# Patient Record
Sex: Female | Born: 1990 | Race: White | Hispanic: No | Marital: Single | Smoking: Former smoker
Health system: Southern US, Community
[De-identification: ages and names within clinical notes are randomized; demographics above are authoritative.]

## PROBLEM LIST (undated history)

## (undated) DIAGNOSIS — D069 Carcinoma in situ of cervix, unspecified: Secondary | ICD-10-CM

## (undated) DIAGNOSIS — R87629 Unspecified abnormal cytological findings in specimens from vagina: Secondary | ICD-10-CM

## (undated) HISTORY — PX: WISDOM TOOTH EXTRACTION: SHX21

## (undated) HISTORY — PX: LEEP: SHX91

## (undated) HISTORY — DX: Carcinoma in situ of cervix, unspecified: D06.9

## (undated) HISTORY — DX: Unspecified abnormal cytological findings in specimens from vagina: R87.629

---
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
## 2011-04-23 ENCOUNTER — Encounter: Payer: Self-pay | Admitting: *Deleted

## 2011-04-23 ENCOUNTER — Encounter: Payer: Self-pay | Admitting: Family Medicine

## 2011-04-23 ENCOUNTER — Other Ambulatory Visit (HOSPITAL_COMMUNITY)
Admission: RE | Admit: 2011-04-23 | Discharge: 2011-04-23 | Disposition: A | Discharge status: Home / Self Care | Payer: Self-pay | Source: Ambulatory Visit | Attending: Family Medicine | Admitting: Family Medicine

## 2011-04-23 ENCOUNTER — Inpatient Hospital Stay (HOSPITAL_COMMUNITY): Admit: 2011-04-23 | Payer: Self-pay

## 2011-04-23 ENCOUNTER — Ambulatory Visit (INDEPENDENT_AMBULATORY_CARE_PROVIDER_SITE_OTHER): Payer: Self-pay | Admitting: Family Medicine

## 2011-04-23 VITALS — BP 130/77 | HR 78 | Temp 99.3°F | Ht 64.5 in | Wt 126.7 lb

## 2011-04-23 DIAGNOSIS — D069 Carcinoma in situ of cervix, unspecified: Secondary | ICD-10-CM

## 2011-04-23 DIAGNOSIS — Z01419 Encounter for gynecological examination (general) (routine) without abnormal findings: Secondary | ICD-10-CM | POA: Insufficient documentation

## 2011-04-23 DIAGNOSIS — R87619 Unspecified abnormal cytological findings in specimens from cervix uteri: Secondary | ICD-10-CM

## 2011-04-23 HISTORY — DX: Carcinoma in situ of cervix, unspecified: D06.9

## 2011-04-23 NOTE — Progress Notes (Signed)
Ariel Stephens is an 20 y.o. female G0 presenting with abnormal pap smear.  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  This was her first pap smear  Pertinent Gynecological History: Menses: flow is light, regular every 28 days without intermenstrual spotting and LMP 04/06/2011 Bleeding: none Contraception: sronyx OCPs DES exposure: denies Blood transfusions: none Sexually transmitted diseases: no past history Previous GYN Procedures: colposcopy  Sexual partners: 2/lifetime Last mammogram: n/a  Last pap: abnormal: ?HSIL Date: 01/2011 OB History: G0, P0   Menstrual History: Menarche age: 20 yo Patient's last menstrual period was 04/06/2011.    Past Medical History  Diagnosis Date  . Abnormal Pap smear 2012    also had colposcopy    Past Surgical History  Procedure Date  . Wisdom tooth extraction     Family History  Problem Relation Age of Onset  . Heart attack Father   . Stroke Father   . Clotting disorder Father     Social History:  reports that she quit smoking about 19 months ago. She does not have any smokeless tobacco history on file. She reports that she does not drink alcohol or use illicit drugs.  Allergies:  Allergies  Allergen Reactions  . Penicillins Rash     (Not in a hospital admission)  ROS Review of Systems - all 12 point ROS negative.  Blood pressure 130/77, pulse 78, temperature 99.3 F (37.4 C), temperature source Oral, height 5' 4.5" (1.638 m), weight 126 lb 11.2 oz (57.471 kg), last menstrual period 04/06/2011. Physical Exam  Vitals reviewed. Constitutional: She is oriented to person, place, and time. She appears well-developed and well-nourished.  Neck: Normal range of motion. Neck supple. No thyromegaly present.  Cardiovascular: Normal rate, regular rhythm and normal heart sounds.  Exam reveals no gallop  and no friction rub.   No murmur heard. Respiratory: Effort normal and breath sounds normal. No respiratory distress. She has no wheezes. She has no rales.  GI: Soft. Bowel sounds are normal. There is no tenderness. There is no rebound and no guarding.  Genitourinary: Vagina normal and uterus normal. No vaginal discharge found.  Lymphadenopathy:    She has no cervical adenopathy.  Neurological: She is alert and oriented to person, place, and time. She has normal reflexes.  Skin: Skin is warm and dry.  Psychiatric: She has a normal mood and affect.  -pap smear performed + bleeding after pap. Prominent TZ zone.   No results found for this or any previous visit (from the past 24 hour(s)).  Assessment/Plan: 1. Abnormal pap-discussed with patient's her options.  [facility]  We don't have pap or colpo results.  So we decided to repeat her pap today with follow up for possible LEEP pending obtaining records and pap results.  F/U next available.   Ariel Stephens 04/23/2011, 3:14 PM

## 2011-04-23 NOTE — Patient Instructions (Signed)
Follow up next available for colposcopy with possible LEEP.   Pap Smear A Pap smear is a sampling of cells from a woman's cervix. The cervix is the opening between the vagina (birth canal) and the uterus (the bottom part of the womb). The cells are scraped from the cervix during a pelvic exam. These cells are then looked at under a microscope to see if the cells are normal or to see if a cancer is developing or there are changes that suggest a cancer will develop. Cervical dysplasia is a condition in which a woman has abnormal changes in the top layer of cells of her cervix. These changes are an early sign that cervical cancer may develop. Pap smears also look for the human papilloma virus (HPV) because it has 4 types that are responsible for 70% of cervical cancer. Infections can also be found during a Pap smear such as bacteria, fungus, protozoa and viruses.  Cervical cancer is harder to treat and less likely to have a good outcome if left untreated. Catching the disease at an early stage leads to a better outcome. Since the Pap smear was introduced 60 years ago, deaths from cervical cancer have decreased by 70%. Every woman should keep up to date with Pap smears. RISK FACTORS FOR CERVICAL CANCER INCLUDE:   Becoming sexually active before age 48.   Being the daughter of a woman who took diethylstilbestrol (DES) during pregnancy.   Having a sexual partner who has or has had cancer of the penis.   Having a sexual partner whose past partner had cervical cancer or cervical dysplasia (early cell changes which suggest a cancer may develop).   Having a weakened immune system. An example would be HIV or other immunodeficiency disorder.   Having had a sexually transmitted infection such as chlamydia, gonorrhea or HPV.   Having had an abnormal Pap smear or cancer of the vagina or vulva.   Having had more than one sexual partner.   A history of cervical cancer in a woman's sister or mother.   Not  using condoms with new sexual partners.   Smoking.  WHO SHOULD HAVE PAP SMEARS  A PAP smear is done to screen for cervical cancer.   The first PAP smear should be done at age 56.   Between ages 81 and 15, PAP smears are repeated every 2 years.   Beginning at age 61, you are advised to have a PAP smear every 3 years as long as your past 3 PAP smears have been normal.   Some women have medical problems that increase the chance of getting cervical cancer. Talk to your caregiver about these problems. It is especially important to talk to your caregiver if a new problem develops soon after your last PAP smear. In these cases, your caregiver may recommend more frequent screening and Pap smears.   The above recommendations are the same for women who have or have not gotten the vaccine for HPV (Human Papillomavirus).   If you had a hysterectomy for a problem that was not a cancer or a condition that could lead to cancer, then you no longer need Pap smears.   If you are between ages 14 and 49, and you have had normal Pap smears going back 10 years, you no longer need Pap smears.   If you have had past treatment for cervical cancer or a condition that could lead to cancer, you need Pap smears and screening for cancer for at least 20  years after your treatment.   Some women may need screenings more often if they are at high risk for cervical cancer.  PREPARATION FOR A PAP SMEAR A Pap smear should be performed during the weeks before the start of menstruation. Women should not douche or have sexual intercourse for 24 hours before the test. No vaginal creams, diaphragms, or tampons should be used for 24 hours before the test. To minimize discomfort, a woman should empty her bladder just before the exam. TAKING THE PAP SMEAR The caregiver will perform a pelvic exam. A metal or plastic instrument (speculum) is placed in the vagina. This is done before your caregiver does a bimanual exam of your internal  female organs. This instrument allows your caregiver to see the inside of the vagina and look at the cervix. A small, sterile brush is used to take a sample of cells from the internal opening of the cervix. A small wooden spatula is used to scrape the outside of the cervix. Neither of these two methods to collect cells will cause you pain. These two scrapings are placed on a glass slide or in a small bottle filled with a special liquid. The cells are looked at later under a microscope in a lab. A specialist will look at these cells and determine if the cells are normal. RESULTS OF YOUR PAP SMEAR  A healthy Pap smear shows no abnormal cells or evidence of inflammation.   The presence of abnormally growing cells on the surface of the cervix may be reported as an abnormal PAP smear. Different categories of findings are used to describe your Pap smear. Your caregiver will go over the importance of these findings with you. The caregiver will then determine what follow-up is needed or when you should have your next pap smear.   If you have had two or more abnormal Pap smears:   You may be asked to have a colposcopy. This is a test in which the cervix is viewed with a special lighted microscope.   A cervical tissue sample (biopsy) may also be needed. This involves taking a small tissue sample from the cervix. The sample is looked at under a microscope to find the cause of the abnormal cells.  Make sure you find out the results of the Pap smear. If you have not received the results within two weeks, contact your caregiver's office for the results. Do not assume everything is normal if you have not heard from your caregiver or medical facility. It is important to follow up on all of your test results. Document Released: 11/21/2002 Document Re-Released: 08/13/2008 [facility] Patient Information 2011 [place], [place].

## 2011-06-15 ENCOUNTER — Encounter: Payer: Self-pay | Admitting: Family Medicine

## 2011-07-01 ENCOUNTER — Encounter: Payer: Self-pay | Admitting: Family Medicine

## 2011-08-20 ENCOUNTER — Encounter: Payer: Self-pay | Admitting: Family Medicine

## 2011-09-16 ENCOUNTER — Encounter: Payer: Self-pay | Admitting: Obstetrics & Gynecology

## 2011-10-15 ENCOUNTER — Encounter: Payer: Self-pay | Admitting: Family Medicine

## 2011-10-15 ENCOUNTER — Ambulatory Visit (INDEPENDENT_AMBULATORY_CARE_PROVIDER_SITE_OTHER): Payer: Self-pay | Admitting: Family Medicine

## 2011-10-15 VITALS — BP 122/85 | HR 94 | Temp 98.7°F | Ht 65.0 in | Wt 127.5 lb

## 2011-10-15 DIAGNOSIS — N871 Moderate cervical dysplasia: Secondary | ICD-10-CM

## 2011-10-15 LAB — POCT PREGNANCY, URINE: Preg Test, Ur: NEGATIVE

## 2011-10-15 NOTE — Progress Notes (Signed)
  Subjective:    Patient ID: Ariel Stephens, female    DOB: Nov 22, 1990, 22 y.o.   MRN: [id number]  HPI Patient seen for LEEP eval.  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  Colposcopy on 01/28/11 showed CIN2.  Was seen here in August, but did not have records.  RePAP done, showed HSIL.  Was not able to have LEEP at that time and is now returning.    Review of Systems     Objective:   Physical Exam  Constitutional: She is oriented to person, place, and time. She appears well-developed and well-nourished.  Neurological: She is alert and oriented to person, place, and time.  Psychiatric: She has a normal mood and affect. Her behavior is normal. Judgment and thought content normal.      Assessment & Plan:  1.  CIN2 LEEP video seen.  Discussed procedure with patient.  Will schedule for next available leep.

## 2011-10-15 NOTE — Patient Instructions (Signed)
Loop Electrosurgical Excision Procedure Loop electrosurgical excision procedure (LEEP) is the removal of a portion of the lower part of the uterus (cervix). The procedure is done when there are significantly abnormal cervical cell changes. Abnormal cell changes of the cervix can lead to cancer if left in place and untreated.  The LEEP procedure itself typically only takes a few minutes. Often, it may be done in your caregiver's office. The procedure is considered safe for those who wish to get pregnant or are trying to get pregnant. Only under rare circumstances should this procedure be done if you are pregnant. LET YOUR CAREGIVER KNOW ABOUT:  Whether you are pregnant or late for your last menstrual period.   Allergies to foods or medicines.   All the medicines you are taking includingherbs, eyedrops, and over-the-counter medicines, and creams.   Use of steroids (by mouth or creams).   Previous problems with anesthetics or numbing medicine.   Previous gynecological surgery.   History of blood clots or bleeding problems.   Any recent or current vaginal infections (herpes, sexually transmitted infections).   Other health problems.  RISKS AND COMPLICATIONS  Bleeding.   Infection.   Injury to the vagina, bladder, or rectum.   Very rare obstruction of the cervical opening that causes problems during menstruation (cervical stenosis).  BEFORE THE PROCEDURE  Do not take aspirin or blood thinners (anticoagulants) for 1 week before the procedure, or as told by your caregiver.   Eat a light meal before the procedure.   Ask your caregiver about changing or stopping your regular medicines.   You may be given a pain reliever 1 or 2 hours before the procedure.  PROCEDURE   A tool (speculum) is placed in the vagina. This allows your caregiver to see the cervix.   An iodine stain is applied to the cervix to find the area of abnormal cells to be removed.   Medicine is injected to numb  the cervix (local anesthetic).    Electricity is passed through a thin wire loop which is then used to remove (cauterize) a small segment of the affected cervix.   Light electrocautery is used to seal any small blood vessels and prevent bleeding.   A paste may be applied to the cauterized area of the cervix to help prevent bleeding.   The tissue sample is sent to the lab. It is examined under the microscope.  AFTER THE PROCEDURE  Have someone drive you home.   You may have slight to moderate cramping.   You may notice a black vaginal discharge from the paste used on the cervix to prevent bleeding. This is normal.   Watch for excessive bleeding. This requires immediate medical care.   Ask when your test results will be ready. Make sure you get your test results.  Document Released: 11/21/2002 Document Revised: 05/13/2011 Document Reviewed: 02/10/2011 [employer] Patient Information 2012 [employer]. 

## 2011-10-22 ENCOUNTER — Encounter: Payer: Self-pay | Admitting: Family Medicine

## 2011-11-16 ENCOUNTER — Ambulatory Visit (INDEPENDENT_AMBULATORY_CARE_PROVIDER_SITE_OTHER): Payer: Self-pay | Admitting: Family Medicine

## 2011-11-16 ENCOUNTER — Encounter: Payer: Self-pay | Admitting: Family Medicine

## 2011-11-16 ENCOUNTER — Other Ambulatory Visit (HOSPITAL_COMMUNITY)
Admission: RE | Admit: 2011-11-16 | Discharge: 2011-11-16 | Disposition: A | Discharge status: Home / Self Care | Payer: Self-pay | Source: Ambulatory Visit | Attending: Family Medicine | Admitting: Family Medicine

## 2011-11-16 VITALS — BP 121/75 | HR 95 | Temp 98.9°F | Ht 65.0 in | Wt 129.1 lb

## 2011-11-16 DIAGNOSIS — Z01812 Encounter for preprocedural laboratory examination: Secondary | ICD-10-CM

## 2011-11-16 DIAGNOSIS — N871 Moderate cervical dysplasia: Secondary | ICD-10-CM

## 2011-11-16 DIAGNOSIS — R87619 Unspecified abnormal cytological findings in specimens from cervix uteri: Secondary | ICD-10-CM | POA: Insufficient documentation

## 2011-11-16 LAB — POCT PREGNANCY, URINE: Preg Test, Ur: NEGATIVE

## 2011-11-16 NOTE — Progress Notes (Signed)
  LEEP PROCEDURE NOTE Pap smear and colposcopy reviewed.   Pap HGSIL Colpo Biopsy CIN2  Risks, benefits, alternatives, and limitations of procedure explained to patient, including pain, bleeding, infection, failure to remove abnormal tissue and failure to cure dysplasia, need for repeat procedures, damage to pelvic organs, cervical incompetence.  Role of HPV,cervical dysplasia and need for close followup was empasized. Informed written consent was obtained. All questions were answered. Time out performed.  ??Procedure: The patient was placed in lithotomy position and the bivalved coated speculum was placed in the patient's vagina. A grounding pad placed on the patient. Lugol's solution was applied to the cervix and areas of decreased uptake were noted 6 and 12 o'clock.   Local anesthesia was administered via an intracervical block using 10cc of 2% Lidocaine with epinephrine. The suction was turned on and the Large 1X Fisher Cone Biopsy Excisor on 50 Watts of cutting current was used to excise the area of decreased uptake and excise the entire transformation zone. Excellent hemostasis was achieved using roller ball coagulation set at 50 Watts coagulation current. Monsel's solution was then applied and the speculum was removed from the vagina. Specimens were sent to pathology. ?The patient tolerated the procedure well. Post-operative instructions given to patient, including instruction to seek medical attention for persistent bright red bleeding, fever, abdominal/pelvic pain, dysuria, nausea or vomiting. She was also told about the possibility of having copious yellow to black tinged discharge. She was counseled to avoid anything in the vagina (sex/douching/tampons) for 4-6 weeks. She has a  4-week post-operative check to review results and assess wound healing. Follow up in 6 months for repeat pap or as needed.

## 2011-11-16 NOTE — Patient Instructions (Signed)
Loop Electrosurgical Excision Procedure Care After Refer to this sheet in the next few weeks. These instructions provide you with information on caring for yourself after your procedure. Your caregiver may also give you more specific instructions. Your treatment has been planned according to current medical practices, but problems sometimes occur. Call your caregiver if you have any problems or questions after your procedure. HOME CARE INSTRUCTIONS   Do not use tampons, douche, or have sexual intercourse for 2 weeks or as directed by your caregiver.   Begin normal activities if you have no or minimal cramping or bleeding, unless directed otherwise by your caregiver.   Take your temperature if you feel sick. Write down your temperature on paper, and tell your caregiver if you have a fever.   Take all medicines as directed by your caregiver.   Keep all your follow-up appointments and Pap tests as directed by your caregiver.  SEEK IMMEDIATE MEDICAL CARE IF:   You have bleeding that is heavier or longer than a normal menstrual cycle.   You have bleeding that is bright red.   You have blood clots.   You have a fever.   You have increasing cramps or pain not relieved by medicine.   You develop abdominal pain that does not seem to be related to the same area of earlier cramping and pain.   You are lightheaded, unusually weak, or faint.   You develop painful or bloody urination.   You develop a bad smelling vaginal discharge.  MAKE SURE YOU:  Understand these instructions.   Will watch your condition.   Will get help right away if you are not doing well or get worse.  Document Released: 05/14/2011 Document Revised: 08/20/2011 Document Reviewed: 05/14/2011 [employer] Patient Information 2012 [employer]. 

## 2011-12-21 ENCOUNTER — Ambulatory Visit (INDEPENDENT_AMBULATORY_CARE_PROVIDER_SITE_OTHER): Payer: Self-pay | Admitting: Advanced Practice Midwife

## 2011-12-21 VITALS — BP 118/75 | HR 96 | Temp 98.9°F

## 2011-12-21 DIAGNOSIS — R85613 High grade squamous intraepithelial lesion on cytologic smear of anus (HGSIL): Secondary | ICD-10-CM

## 2011-12-21 DIAGNOSIS — R87613 High grade squamous intraepithelial lesion on cytologic smear of cervix (HGSIL): Secondary | ICD-10-CM

## 2011-12-21 DIAGNOSIS — D069 Carcinoma in situ of cervix, unspecified: Secondary | ICD-10-CM

## 2011-12-21 MED ORDER — LEVONORGESTREL-ETHINYL ESTRAD 0.1-20 MG-MCG PO TABS: 1.0000 | ORAL_TABLET | Freq: Every day | ORAL | Status: DC

## 2011-12-21 NOTE — Progress Notes (Signed)
  Subjective:    Patient ID: Ariel Stephens, female    DOB: [birth year], 21 y.o.   MRN: [id number]  HPI This is a 21 y.o. female who presents for results from the LEEP procedure she had done in March. She had the procedure done for HGSIL seen on biopsy. She recovered well, had the usual discharge which stopped. No pain or bleeding since procedure.   Review of Systems As discussed above.    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 - ENDOCERVICAL ADENOCARCINOMA IN SITU, FOCALLY INVOLVING THE INKED ENDOCERVICAL MARGIN. Microscopic Comment        Assessment & Plan:  Discussed with Dr Jolayne Panther. Patient informed biopsy results were abnormal as we expected.  Per Constant, we should  repeat pap in 6 mos and see if any abnormal cells persist. Pt agreeable

## 2011-12-21 NOTE — Patient Instructions (Signed)
Oral Contraception Information Oral contraceptives (OCs) are medicines taken to prevent pregnancy. OCs work by preventing the ovaries from releasing eggs. The hormones in OCs also cause the cervical mucus to thicken, preventing the sperm from entering the uterus. The hormones also cause the uterine lining to become thin, not allowing a fertilized egg to attach to the inside of the uterus. OCs are highly effective when taken exactly as prescribed. However, OCs do not prevent sexually transmitted diseases (STDs). Safe sex practices, such as using condoms along with the pill, can help prevent STDs.  Before taking the pill, you may have a physical exam and Pap test. Your caregiver may order blood tests that may be necessary. Your caregiver will make sure you are a good candidate for oral contraception. Discuss with your caregiver the possible side effects of the OC you may be prescribed. When starting an OC, it can take 2 to 3 months for the body to adjust to the changes in hormone levels in your body.  TYPES OF ORAL CONTRACEPTION  The combination pill. This pill contains estrogen and progestin (synthetic progesterone) hormones. The combination pill comes in either 21-day or 28-day packs. With 21-day packs, you do not take pills for 7 days after the last pill. With 28-day packs, the pill is taken every day. The last 7 pills are without hormones. Certain types of pills have more than 21 hormone-containing pills.   The minipill. This pill contains the progesterone hormone only. It is taken every day continuously. The minipill comes in packs of 91 pills. The first 84 pills contain the hormones, and the last 7 pills do not. The last 7 days are when you will have your menstrual period. You may experience irregular spotting.  ADVANTAGES  Decreases premenstrual symptoms.   Treats menstrual period cramps.   Regulates the menstrual cycle.   Decreases a heavy menstrual flow.   Treats acne.   Treats abnormal  uterine bleeding.   Treats chronic pelvic pain.   Treats polycystic ovarian syndrome.   Treats endometriosis.   Can be used as emergency contraception.  DISADVANTAGES OCs can be less effective if:  You forget to take the pill at the same time every day.   You have a stomach or intestinal disease that lessens the absorption of the pill.   You take OCs with other medicines that make OCs less effective.   You take expired OCs.   You forget to restart the pill on day 7, when using the packs of 21 pills.  Document Released: 11/21/2002 Document Revised: 08/20/2011 Document Reviewed: 01/07/2011 [facility] Patient Information 2012 [place], [place].Abnormal Pap Test WHAT DOES A PAP TEST CHECK FOR? A Pap test checks the cells in the cervix for any infections or changes that may turn into cancer. The cells are checked to see if they look normal or if they show any changes (abnormal). Abnormal changes may be a sign of problems with your cervix. Cervical cells that are abnormal are called cervical dysplasia. Dysplasia is not cancer. It is a pre-cancerous change in the cells of your cervix. WHAT DOES AN ABNORMAL PAP TEST MEAN? Most times, an abnormal Pap test does not mean you have cancer. However, it does show that there may be a problem. Your doctor will want to do other tests to find out more about the abnormal cells.  Your abnormal Pap test results could show:  Small changes that should be carefully watched.   Dysplasia that could grow into cancer.   Cancer.  When dysplasia is found and treated early, it most often does not grow into cancer. WHAT WILL BE DONE ABOUT MY ABNORMAL PAP TEST? You may have:  A colposcopy test done. Your cevix will be looked at using a strong light and a microscope.   A cone biopsy. A small, cone-shaped sample of your cervix is taken out. The part that is taken out is the area where the abnormal cells are.   Cryosurgery. The abnormal cells on your cervix will be  frozen.   Loop Electrical Excision Procedure (LEEP). The abnormal cells will be taken out.  WHAT IF I HAVE A DYSPLASIA OR A CANCER? You and your doctor may choose a hysterectomy as the best treatment for you. During this surgery, your womb (uterus) and your cervix will be taken out. WHAT SHOULD YOU DO AFTER BEING TREATED? Keep having Pap tests and checkups as often as your doctor tells you. Your cervical problem will be carefully watched so it does not get worse. Also, your doctor can watch for, and treat, any new problems that may come up. Document Released: 12/16/2010 Document Revised: 08/20/2011 Document Reviewed: 08/02/2011 [facility] Patient Information 2012 [place], [place].

## 2011-12-22 ENCOUNTER — Encounter: Payer: Self-pay | Admitting: Advanced Practice Midwife

## 2011-12-22 DIAGNOSIS — D069 Carcinoma in situ of cervix, unspecified: Secondary | ICD-10-CM

## 2011-12-22 DIAGNOSIS — R87613 High grade squamous intraepithelial lesion on cytologic smear of cervix (HGSIL): Secondary | ICD-10-CM | POA: Insufficient documentation

## 2011-12-22 HISTORY — DX: Carcinoma in situ of cervix, unspecified: D06.9

## 2012-11-16 ENCOUNTER — Telehealth: Payer: Self-pay | Admitting: General Practice

## 2012-11-16 NOTE — Telephone Encounter (Signed)
Patient called and left message stating she needs a refill on her birth control pills and wants to know if it can be refilled or if she needs to come back in and be seen and would like a call back. Called patient back and told her I received her message and upon reviewing her chart it looks like she was last here in April of 2013 for a LEEP procedure, patient said that was correct. Told patient upon reviewing the doctors note from that day it looks like she needs to come back in and have a pap smear to follow up from the LEEP. Told patient she could come in for the pap and talk to someone then about her birth control. Patient verbalized understanding. Told patient I would transfer her to the front office so she could get her appt scheduled. Patient verbalized understanding and had no further questions. appt made for 3/20 @ 2:45

## 2012-12-01 ENCOUNTER — Ambulatory Visit (INDEPENDENT_AMBULATORY_CARE_PROVIDER_SITE_OTHER): Payer: Self-pay | Admitting: Obstetrics & Gynecology

## 2012-12-01 ENCOUNTER — Encounter: Payer: Self-pay | Admitting: Obstetrics & Gynecology

## 2012-12-01 VITALS — BP 125/78 | HR 71 | Temp 100.1°F | Ht 65.0 in | Wt 137.4 lb

## 2012-12-01 DIAGNOSIS — Z3049 Encounter for surveillance of other contraceptives: Secondary | ICD-10-CM

## 2012-12-01 DIAGNOSIS — N879 Dysplasia of cervix uteri, unspecified: Secondary | ICD-10-CM

## 2012-12-01 MED ORDER — LEVONORGESTREL-ETHINYL ESTRAD 0.1-20 MG-MCG PO TABS: 1.0000 | ORAL_TABLET | Freq: Every day | ORAL | Status: DC

## 2012-12-01 NOTE — Progress Notes (Signed)
Patient requests upt because she has missed some periods and wants to be sure.

## 2012-12-01 NOTE — Patient Instructions (Signed)
Cervical Dysplasia Cervical dysplasia is a condition in which a woman has abnormal changes in the cells of her cervix. The cervix is the opening to the uterus (womb) between the vagina and the uterus. These changes are called cervical dysplasia and may be the first signs of cervical cancer. These cells can be taken from the cervix during a Pap test and then looked at under a microscope. With early detection, treatment, and close follow-up care, nearly all cervical dysplasia can be cured. If untreated, the mild to moderate stages of dysplasia often grow more severe.  RISK FACTORS  The following increase the risk for cervical dysplasia.  Having had a sexually transmitted disease, including:  Chlamydia.  Human papilloma virus (HPV).  Becoming sexually active before age 37.  Having had more than 1 sexual partner.  Not using protection, such as condoms, during sexual intercourse, especially with new sexual partners.  Having had cancer of the vagina or vulva.  Having a sexual partner whose previous partner had cancer of the cervix or cervical dysplasia.  Having a sexual partner who has or has had cancer of the penis.  Having a weakened immune system (HIV, organ transplant).  Being the daughter of a woman who took DES (diethylstilbestrol) during pregnancy.  A history of cervical cancer in a woman's sister or mother.  Smoking.  Having had an abnormal Pap test in the past. SYMPTOMS  There are usually no symptoms. If there are symptoms, they may be vague such as:  Abnormal vaginal discharge.  Bleeding between periods or following intercourse.  Bleeding during menopause.  Pain on intercourse (dyspareunia). DIAGNOSIS   The Pap test is the best way of detecting abnormalities of the cervix.  Biopsy (removing a piece of tissue to look at under the microscope) of the cervix when the Pap test is abnormal or when the Pap test is normal, but the cervix looks abnormal. TREATMENT   Catching and treating the changes early with Pap tests can prevent cervical cancer.  Cryotherapy freezes the abnormal cells with a steel tip instrument.  A laser can be used to remove the abnormal cells.  Loop electrocautery excision procedure (LEEP). This procedure uses a heated electrical loop to remove a cone-like portion of the cervix, including the cervical canal.  For more serious cases of cervical dysplasia, the abnormal tissue may be removed surgically by:  A cone biopsy (by cold knife, laser or LEEP). A procedure in which a portion of the center of the cervix with the cervical canal is removed.  The uterus and cervix are removed (hysterectomy). Your caregiver will advise you regarding the need and timing of Pap tests in your follow-up. Women who have been treated for dysplasia should be closely followed with pelvic exams and Pap tests. During the first year following treatment of cervical dysplasia, Pap tests should be done every 3 to 4 months. In the second year, the schedule is every 6 months, or as recommended by your caregiver. See your caregiver for new or worsening problems. HOME CARE INSTRUCTIONS   Follow the instructions and recommendations of your caregiver regarding medicines and follow-up appointments.  Only take over-the-counter or prescription medicines for pain or discomfort as directed by your caregiver.  Cramping and pelvic discomfort may follow cryotherapy. It is not abnormal to have watery discharge for several weeks after.  Laser, cone surgery, cryotherapy or LEEP can cause a bad smelling vaginal discharge. It may also cause vaginal bleeding for a couple weeks following the procedure. The discharge  may be black from the paste used to control bleeding from the cone site. This is normal.  Do not use tampons, have sexual intercourse or douche until your caregiver says it is okay. SEEK MEDICAL CARE IF:   You develop genital warts.  You need a prescription for  pain medicine following your treatment. SEEK IMMEDIATE MEDICAL CARE IF:   Your bleeding is heavier than a normal menstrual period.  You develop bright red bleeding, especially if you have blood clots.  You have a fever.  You have increasing cramps or pain not relieved with medicine.  You are lightheaded, unusually weak, or have fainting spells.  You have abnormal vaginal discharge.  You develop abdominal pain. PREVENTION   The surest way to prevent cervical dysplasia is to abstain from sexual intercourse.  Practice safe sex, use condoms and have only one sex partner who does not have other sex partners.  A Pap test is done to screen for cervical cancer.  The first Pap test should be done at age 23.  Between ages 21 and 75, Pap tests are repeated every 2 years.  Beginning at age 23, you are advised to have a Pap test every 3 years as long as your past 3 Pap tests have been normal.  Some women have medical problems that increase the chance of getting cervical cancer. Talk to your caregiver about these problems. It is especially important to talk to your caregiver if a new problem develops soon after your last Pap test. In these cases, your caregiver may recommend more frequent screening and Pap tests.  The above recommendations are the same for women who have or have not gotten the vaccine for HPV (Human Papillomavirus).  If you had a hysterectomy for a problem that was not a cancer or a condition that could lead to cancer, then you no longer need Pap tests. However, even if you no longer need a Pap test, a regular exam is a good idea to make sure no other problems are starting.   If you are between ages 23 and 84, and you have had normal Pap tests going back 10 years, you no longer need Pap tests. However, even if you no longer need a Pap test, a regular exam is a good idea to make sure no other problems are starting.   If you have had past treatment for cervical cancer or a  condition that could lead to cancer, you need Pap tests and screening for cancer for at least 20 years after your treatment.  If Pap tests have been discontinued, risk factors (such as a new sexual partner) need to be re-assessed to determine if screening should be resumed.  Some women may need screenings more often if they are at high risk for cervical cancer.  Your caregiver may do additional tests including:  Colposcopy. A procedure in which a special microscope magnifies the cells and allows the provider to closely examine the cervix, vagina, and vulva.  Biopsy. A small tissue sample is taken from the cervix, vagina or vulva. This is generally done in your caregivers office.  A cone biopsy (cold knife or laser). A large tissue sample is taken from the cervix. This procedure is usually done in an operating room under a general anesthetic. The cone often removes all abnormal tissue and so may also complete the treatment.  LEEP, also removing a circular portion of the cervix and is done in a doctors office under a local anesthetic.  Now there  is a vaccine, Gardasil, that was developed to prevent the HPV'S that can cause cancer of the cervix and genital warts. It is recommended for females ages 45 to 28. It should not be given to pregnant women until more is known about its effects on the fetus. Not all cancers of the cervix are caused by the HPV. Routine gynecology exams and Pap tests should continue as recommended by your caregiver. Document Released: 08/31/2005 Document Revised: 11/23/2011 Document Reviewed: 08/22/2008 [facility] Patient Information 2013 [place], [place]. Contraception Choices Contraception (birth control) is the use of any methods or devices to prevent pregnancy. Below are some methods to help avoid pregnancy. HORMONAL METHODS   Contraceptive implant. This is a thin, plastic tube containing progesterone hormone. It does not contain estrogen hormone. Your caregiver inserts  the tube in the inner part of the upper arm. The tube can remain in place for up to 3 years. After 3 years, the implant must be removed. The implant prevents the ovaries from releasing an egg (ovulation), thickens the cervical mucus which prevents sperm from entering the uterus, and thins the lining of the inside of the uterus.  Progesterone-only injections. These injections are given every 3 months by your caregiver to prevent pregnancy. This synthetic progesterone hormone stops the ovaries from releasing eggs. It also thickens cervical mucus and changes the uterine lining. This makes it harder for sperm to survive in the uterus.  Birth control pills. These pills contain estrogen and progesterone hormone. They work by stopping the egg from forming in the ovary (ovulation). Birth control pills are prescribed by a caregiver.Birth control pills can also be used to treat heavy periods.  Minipill. This type of birth control pill contains only the progesterone hormone. They are taken every day of each month and must be prescribed by your caregiver.  Birth control patch. The patch contains hormones similar to those in birth control pills. It must be changed once a week and is prescribed by a caregiver.  Vaginal ring. The ring contains hormones similar to those in birth control pills. It is left in the vagina for 3 weeks, removed for 1 week, and then a new one is put back in place. The patient must be comfortable inserting and removing the ring from the vagina.A caregiver's prescription is necessary.  Emergency contraception. Emergency contraceptives prevent pregnancy after unprotected sexual intercourse. This pill can be taken right after sex or up to 5 days after unprotected sex. It is most effective the sooner you take the pills after having sexual intercourse. Emergency contraceptive pills are available without a prescription. Check with your pharmacist. Do not use emergency contraception as your only  form of birth control. BARRIER METHODS   Female condom. This is a thin sheath (latex or rubber) that is worn over the penis during sexual intercourse. It can be used with spermicide to increase effectiveness.  Female condom. This is a soft, loose-fitting sheath that is put into the vagina before sexual intercourse.  Diaphragm. This is a soft, latex, dome-shaped barrier that must be fitted by a caregiver. It is inserted into the vagina, along with a spermicidal jelly. It is inserted before intercourse. The diaphragm should be left in the vagina for 6 to 8 hours after intercourse.  Cervical cap. This is a round, soft, latex or plastic cup that fits over the cervix and must be fitted by a caregiver. The cap can be left in place for up to 48 hours after intercourse.  Sponge. This is a  soft, circular piece of polyurethane foam. The sponge has spermicide in it. It is inserted into the vagina after wetting it and before sexual intercourse.  Spermicides. These are chemicals that kill or block sperm from entering the cervix and uterus. They come in the form of creams, jellies, suppositories, foam, or tablets. They do not require a prescription. They are inserted into the vagina with an applicator before having sexual intercourse. The process must be repeated every time you have sexual intercourse. INTRAUTERINE CONTRACEPTION  Intrauterine device (IUD). This is a T-shaped device that is put in a woman's uterus during a menstrual period to prevent pregnancy. There are 2 types:  Copper IUD. This type of IUD is wrapped in copper wire and is placed inside the uterus. Copper makes the uterus and fallopian tubes produce a fluid that kills sperm. It can stay in place for 10 years.  Hormone IUD. This type of IUD contains the hormone progestin (synthetic progesterone). The hormone thickens the cervical mucus and prevents sperm from entering the uterus, and it also thins the uterine lining to prevent implantation of a  fertilized egg. The hormone can weaken or kill the sperm that get into the uterus. It can stay in place for 5 years. PERMANENT METHODS OF CONTRACEPTION  Female tubal ligation. This is when the woman's fallopian tubes are surgically sealed, tied, or blocked to prevent the egg from traveling to the uterus.  Female sterilization. This is when the female has the tubes that carry sperm tied off (vasectomy).This blocks sperm from entering the vagina during sexual intercourse. After the procedure, the man can still ejaculate fluid (semen). NATURAL PLANNING METHODS  Natural family planning. This is not having sexual intercourse or using a barrier method (condom, diaphragm, cervical cap) on days the woman could become pregnant.  Calendar method. This is keeping track of the length of each menstrual cycle and identifying when you are fertile.  Ovulation method. This is avoiding sexual intercourse during ovulation.  Symptothermal method. This is avoiding sexual intercourse during ovulation, using a thermometer and ovulation symptoms.  Post-ovulation method. This is timing sexual intercourse after you have ovulated. Regardless of which type or method of contraception you choose, it is important that you use condoms to protect against the transmission of sexually transmitted diseases (STDs). Talk with your caregiver about which form of contraception is most appropriate for you. Document Released: 08/31/2005 Document Revised: 11/23/2011 Document Reviewed: 01/07/2011 [facility] Patient Information 2013 [place], [place].

## 2012-12-01 NOTE — Progress Notes (Signed)
Subjective:     Patient ID: Ariel Stephens, female   DOB: [birth year], 22 y.o.   MRN: [id number]  HPI pt presents for repeat PAP.  She reports that she did not know that she was to come back in 6 months until she called to make this appt.  She reports no menses for 2 months.  She reports that she has been taking her pills daily without missing pills.  She wants to keep t the same OCP if possible.   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 - ENDOCERVICAL ADENOCARCINOMA IN SITU, FOCALLY INVOLVING THE INKED ENDOCERVICAL MARGIN.  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  I explained the risk of this to the pt as well as explained to her the need for close surveillance.        Plan:     F/u PAP F/u in 6months for repeat PAPrefill OCP's

## 2012-12-02 LAB — POCT PREGNANCY, URINE: Preg Test, Ur: NEGATIVE

## 2012-12-20 ENCOUNTER — Telehealth: Payer: Self-pay | Admitting: *Deleted

## 2012-12-20 NOTE — Telephone Encounter (Addendum)
Pt left message requesting her Pap result. I returned pt's call and informed her of normal Pap. She will need another in 6 mos (September) then if normal, she will be able to have Paps every 3 yrs.  Pt voiced understanding.

## 2013-07-27 ENCOUNTER — Encounter: Payer: Self-pay | Admitting: Nurse Practitioner

## 2013-07-27 ENCOUNTER — Ambulatory Visit (INDEPENDENT_AMBULATORY_CARE_PROVIDER_SITE_OTHER): Payer: BC Managed Care – PPO | Admitting: Nurse Practitioner

## 2013-07-27 VITALS — BP 121/85 | HR 89 | Ht 66.0 in | Wt 140.6 lb

## 2013-07-27 DIAGNOSIS — R87613 High grade squamous intraepithelial lesion on cytologic smear of cervix (HGSIL): Secondary | ICD-10-CM

## 2013-07-27 DIAGNOSIS — Z01419 Encounter for gynecological examination (general) (routine) without abnormal findings: Secondary | ICD-10-CM

## 2013-07-27 NOTE — Progress Notes (Signed)
History:  Ariel Stephens is a 22 y.o. G0P0 who presents to clinic today for a follow up pap smear. She had an abnormal pap ( HSIL ) at [facility] and was asked to follow up in this clinic. She had a LEEP on March 2013 and has only had one follow up pap smear and it was negative.   The following portions of the patient's history were reviewed and updated as appropriate: allergies, current medications, past family history, past medical history, past social history, past surgical history and problem list.  Review of Systems:  Pertinent items are noted in HPI.  Objective:  Physical Exam BP 121/85  Pulse 89  Ht 5\' 6"  (1.676 m)  Wt 140 lb 9.6 oz (63.776 kg)  BMI 22.70 kg/m2 GENERAL: Well-developed, well-nourished female in no acute distress.  HEENT: Normocephalic, atraumatic.  ABDOMEN: Soft, nontender, nondistended. No organomegaly. Normal bowel sounds appreciated in all quadrants.  PELVIC: Normal external female genitalia. Vagina is pink and rugated.  Normal discharge. Normal cervix contour. Pap smear obtained. Uterus is normal in size. No adnexal mass or tenderness.  EXTREMITIES: No cyanosis, clubbing, or edema, 2+ distal pulses.   Labs and Imaging No results found.  Assessment & Plan:  Assessment:  Abnormal pap/ HSIL  Plans:  Repeat pap today and again in 6 months  Carolynn Serve, NP 07/27/2013 5:13 PM

## 2013-07-27 NOTE — Patient Instructions (Signed)
Abnormal Pap Test Information During a Pap test, the cells on the surface of your cervix are checked to see if they look normal, abnormal, or if they show signs of having been altered by a certain type of virus called human papillomavirus, or HPV. Cervical cells that have been affected by HPV are called dysplasia. Dysplasia is not cancer, but describes abnormal cells found on the surface of the cervix. Depending on the degree of dysplasia, some of the cells may be considered pre-cancerous and may turn into cancer over time if follow up with a caregiver is delayed.  WHAT DOES AN ABNORMAL PAP TEST MEAN? Having an abnormal pap test does not mean that you have cancer. However, certain types of abnormal pap tests can be a sign that a person is at a higher risk of developing cancer. Your caregiver will want to do other tests to find out more about the abnormal cells. Your abnormal Pap test results could show:   Small and uncertain changes that should be carefully watched.   Cervical dysplasia that has caused mild changes and can be followed over time.  Cervical dysplasia that is more severe and needs to be followed and treated to ensure the problem goes away.  Cancer.  When severe cervical dysplasia is found and treated early, it rarely will grow into cancer.  WHAT WILL BE DONE ABOUT MY ABNORMAL PAP TEST?  A colposcopy may be needed. This is a procedure where your cervix is examined using light and magnification.  A small tissue sample of your cervix (biopsy) may need to be removed and then examined. This is often performed if there are areas that appear infected.  A sample of cells from the cervical canal may be removed with either a small brush or scraping instrument (curette). Based on the results of the procedures above, some caregivers may recommend either cryotherapy of the cervix or a surgical LEEP where a portion of the cervix is removed. LEEP is short for "loop electrical excisional  procedure." Rarely, a caregiver may recommend a cone biopsy.This is a procedure where a small, cone-shaped sample of your cervix is taken out. The part that is taken out is the area where the abnormal cells are.  WHAT IF I HAVE A DYSPLASIA OR A CANCER? You may be referred to a specialist. Radiation may also be a treatment for more advanced cancer. Having a hysterectomy is the last treatment option for dysplasia, but it is a more common treatment for someone with cancer. All treatment options will be discussed with you by your caregiver. WHAT SHOULD YOU DO AFTER BEING TREATED? If you have had an abnormal pap test, you should continue to have regular pap tests and check-ups as directed by your caregiver. Your cervical problem will be carefully watched so it does not get worse. Also, your caregiver can watch for, and treat, any new problems that may come up. Document Released: 12/16/2010 Document Revised: 12/26/2012 Document Reviewed: 08/27/2011 [employer] Patient Information 2014 [employer].  

## 2013-12-12 ENCOUNTER — Telehealth: Payer: Self-pay | Admitting: *Deleted

## 2013-12-12 DIAGNOSIS — Z3049 Encounter for surveillance of other contraceptives: Secondary | ICD-10-CM

## 2013-12-12 MED ORDER — LEVONORGESTREL-ETHINYL ESTRAD 0.1-20 MG-MCG PO TABS: 1.0000 | ORAL_TABLET | Freq: Every day | ORAL | Status: DC

## 2013-12-12 NOTE — Telephone Encounter (Signed)
Ariel Stephens left a message requesting a refill of her birth control. Refill sent to her pharmacy for 3 months per protocol. Called Ariel Stephens and notified her. She is scheduling a May appointment for her next gyn visit.

## 2014-01-26 ENCOUNTER — Encounter: Payer: Self-pay | Admitting: Nurse Practitioner

## 2014-01-26 ENCOUNTER — Ambulatory Visit (INDEPENDENT_AMBULATORY_CARE_PROVIDER_SITE_OTHER): Payer: BC Managed Care – PPO | Admitting: Nurse Practitioner

## 2014-01-26 VITALS — BP 117/70 | HR 80 | Temp 98.6°F | Ht 65.0 in | Wt 134.6 lb

## 2014-01-26 DIAGNOSIS — Z3049 Encounter for surveillance of other contraceptives: Secondary | ICD-10-CM

## 2014-01-26 DIAGNOSIS — R87613 High grade squamous intraepithelial lesion on cytologic smear of cervix (HGSIL): Secondary | ICD-10-CM

## 2014-01-26 DIAGNOSIS — Z309 Encounter for contraceptive management, unspecified: Secondary | ICD-10-CM | POA: Insufficient documentation

## 2014-01-26 MED ORDER — LEVONORGESTREL-ETHINYL ESTRAD 0.1-20 MG-MCG PO TABS: 1.0000 | ORAL_TABLET | Freq: Every day | ORAL | Status: DC

## 2014-01-26 NOTE — Patient Instructions (Signed)
Abnormal Pap Test Information During a Pap test, the cells on the surface of your cervix are checked to see if they look normal, abnormal, or if they show signs of having been altered by a certain type of virus called human papillomavirus, or HPV. Cervical cells that have been affected by HPV are called dysplasia. Dysplasia is not cancer, but describes abnormal cells found on the surface of the cervix. Depending on the degree of dysplasia, some of the cells may be considered pre-cancerous and may turn into cancer over time if follow up with a caregiver is delayed.  WHAT DOES AN ABNORMAL PAP TEST MEAN? Having an abnormal pap test does not mean that you have cancer. However, certain types of abnormal pap tests can be a sign that a person is at a higher risk of developing cancer. Your caregiver will want to do other tests to find out more about the abnormal cells. Your abnormal Pap test results could show:   Small and uncertain changes that should be carefully watched.   Cervical dysplasia that has caused mild changes and can be followed over time.  Cervical dysplasia that is more severe and needs to be followed and treated to ensure the problem goes away.  Cancer.  When severe cervical dysplasia is found and treated early, it rarely will grow into cancer.  WHAT WILL BE DONE ABOUT MY ABNORMAL PAP TEST?  A colposcopy may be needed. This is a procedure where your cervix is examined using light and magnification.  A small tissue sample of your cervix (biopsy) may need to be removed and then examined. This is often performed if there are areas that appear infected.  A sample of cells from the cervical canal may be removed with either a small brush or scraping instrument (curette). Based on the results of the procedures above, some caregivers may recommend either cryotherapy of the cervix or a surgical LEEP where a portion of the cervix is removed. LEEP is short for "loop electrical excisional  procedure." Rarely, a caregiver may recommend a cone biopsy.This is a procedure where a small, cone-shaped sample of your cervix is taken out. The part that is taken out is the area where the abnormal cells are.  WHAT IF I HAVE A DYSPLASIA OR A CANCER? You may be referred to a specialist. Radiation may also be a treatment for more advanced cancer. Having a hysterectomy is the last treatment option for dysplasia, but it is a more common treatment for someone with cancer. All treatment options will be discussed with you by your caregiver. WHAT SHOULD YOU DO AFTER BEING TREATED? If you have had an abnormal pap test, you should continue to have regular pap tests and check-ups as directed by your caregiver. Your cervical problem will be carefully watched so it does not get worse. Also, your caregiver can watch for, and treat, any new problems that may come up. Document Released: 12/16/2010 Document Revised: 12/26/2012 Document Reviewed: 08/27/2011 [employer] Patient Information 2014 [employer].  

## 2014-01-26 NOTE — Progress Notes (Signed)
History:  Natalia LeatherwoodKathleen D Chalker is a 23 y.o. G0P0 who [facility] pap and refill on BCPs.  This is her third pap today following HSIL and LEEP. In March 2013. She is sexually active with her husband only.  The following portions of the patient's history were reviewed and updated as appropriate: allergies, current medications, past family history, past medical history, past social history, past surgical history and problem list.  Review of Systems:  Pertinent items are noted in HPI.  Objective:  Physical Exam BP 117/70  Pulse 80  Temp(Src) 98.6 F (37 C) (Oral)  Ht 5\' 5"  (1.651 m)  Wt 134 lb 9.6 oz (61.054 kg)  BMI 22.40 kg/m2  LMP 01/15/2014 GENERAL: Well-developed, well-nourished female in no acute distress.  HEENT: Normocephalic, atraumatic.  PELVIC: Normal external female genitalia. Vagina is pink and rugated.  Normal discharge. Normal cervix contour. Pap smear obtained. Uterus is normal in size. No adnexal mass or tenderness.  EXTREMITIES: No cyanosis, clubbing, or edema, 2+ distal pulses.   Labs and Imaging No results found.  Assessment & Plan:  Assessment:  Abnormal pap follow up  Plans:  If this pap is negative she may go to yearly screening She will be informed of results Refill Alesse x 1 year  Delbert PhenixLinda M Barefoot, NP 01/26/2014 1:25 PM

## 2015-01-10 ENCOUNTER — Telehealth: Payer: Self-pay | Admitting: General Practice

## 2015-01-10 DIAGNOSIS — Z3041 Encounter for surveillance of contraceptive pills: Secondary | ICD-10-CM

## 2015-01-10 MED ORDER — LEVONORGESTREL-ETHINYL ESTRAD 0.1-20 MG-MCG PO TABS: 1.0000 | ORAL_TABLET | Freq: Every day | ORAL | Status: DC

## 2015-01-10 NOTE — Telephone Encounter (Signed)
Patient called and left message stating she was told recently she didn't need a pap seem until 3 years from now. Patient states in the past she thought she has had problems with her pap and wants to make sure this is correct. Also she needs her OCPs refilled. Per chart review patient does need pap this year. Called patient and explained she does need a pap smear and asked if she had insurance. Patient states no. Asked patient if she has ever gone to [facility] for care since she doesn't have insurance. Patient states no she has just come here and paid out of pocket which is fine for her. Told patient I will let the front office know she needs an annual exam and they will contact you with that appt and we will get a couple refills on her OCPs until she can come in. Patient verbalized understanding and had no other questions

## 2015-01-15 ENCOUNTER — Encounter: Payer: Self-pay | Admitting: Obstetrics & Gynecology

## 2015-02-20 ENCOUNTER — Encounter: Payer: Self-pay | Admitting: Obstetrics & Gynecology

## 2015-02-20 ENCOUNTER — Ambulatory Visit (INDEPENDENT_AMBULATORY_CARE_PROVIDER_SITE_OTHER): Payer: Self-pay | Admitting: Obstetrics & Gynecology

## 2015-02-20 VITALS — BP 119/68 | HR 78 | Temp 98.7°F | Ht 65.0 in | Wt 130.8 lb

## 2015-02-20 DIAGNOSIS — D069 Carcinoma in situ of cervix, unspecified: Secondary | ICD-10-CM

## 2015-02-20 DIAGNOSIS — D060 Carcinoma in situ of endocervix: Secondary | ICD-10-CM

## 2015-02-20 DIAGNOSIS — Z3041 Encounter for surveillance of contraceptive pills: Secondary | ICD-10-CM

## 2015-02-20 MED ORDER — LEVONORGESTREL-ETHINYL ESTRAD 0.1-20 MG-MCG PO TABS: 1.0000 | ORAL_TABLET | Freq: Every day | ORAL | Status: DC

## 2015-02-20 NOTE — Progress Notes (Addendum)
   CLINIC ENCOUNTER NOTE  History:  24 y.o. G0P0 here today for follow up.  Wants to know if she needs a pap smear today. Also desires OCP refill. Denies any vaginal bleeding, discharge or other concerns.  Past Medical History:  Diagnosis Date  . Abnormal Pap smear 2012   also had colposcopy  . 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 Was focal on endocervical margin. Three subsequent pap smears were normal.  . CIN III (cervical intraepithelial neoplasia III) 04/23/2011    Past Surgical History:  Procedure Laterality Date  . WISDOM TOOTH EXTRACTION     The following portions of the patient's history were reviewed and updated as appropriate: allergies, current medications, past family history, past medical history, past social history, past surgical history and problem list.   Health Maintenance:  Normal pap and negative HRHPV on 01/26/2014; also had normal paps on 07/27/2013 and 12/01/2012.  Had HGSIL pap and CIN III on colposcopy and subsequent LEEP in 2012, showed focus of adenocarcinoma in situ.  Review of Systems:  Comprehensive review of systems negative.  Objective:  Physical Exam BP 119/68   Pulse 78   Temp 98.7 F (37.1 C) (Oral)   Ht 5\' 5"  (1.651 m)   Wt 130 lb 12.8 oz (59.3 kg)   LMP 01/28/2015   BMI 21.77 kg/m  CONSTITUTIONAL: Well-developed, well-nourished female in no acute distress.  HENT:  Normocephalic, atraumatic, External right and left ear normal. Oropharynx is clear and moist EYES: Conjunctivae and EOM are normal. Pupils are equal, round, and reactive to light. No scleral icterus.  NECK: Normal range of motion, supple, no masses SKIN: Skin is warm and dry. No rash noted. Not diaphoretic. No erythema. No pallor. NEUROLGIC: Alert and oriented to person, place, and time. Normal reflexes, muscle tone coordination. No cranial nerve deficit noted. PSYCHIATRIC: Normal mood and affect. Normal behavior. Normal judgment and thought  content. CARDIOVASCULAR: Normal heart rate noted RESPIRATORY: Effort and breath sounds normal, no problems with respiration noted ABDOMEN: Soft, no distention noted.   PELVIC: Deferred MUSCULOSKELETAL: Normal range of motion. No edema and no tenderness.  Assessment & Plan:  Patient assured that she does not need another pap smear, unless she desires, until 01/26/2017 as per current guidelines. OCPs refilled as per her request; given enough refills for one year Routine preventative health maintenance measures emphasized. Please refer to After Visit Summary for other counseling recommendations.    Jaynie CollinsUGONNA  ANYANWU, MD, FACOG Attending Obstetrician & Gynecologist Center for [facility], [facility]

## 2015-02-20 NOTE — Patient Instructions (Addendum)
You are due for next pap smear 01/26/2017 or earlier if desired.  [facility] or [facility] in the [facility].

## 2016-04-24 ENCOUNTER — Telehealth: Payer: Self-pay

## 2016-04-24 NOTE — Telephone Encounter (Signed)
Patient left a message about her birth control pills. When I call back there was a man that answer the phone and when I ask for the patient he hung the phone up.

## 2016-04-29 ENCOUNTER — Other Ambulatory Visit: Payer: Self-pay | Admitting: *Deleted

## 2016-04-29 DIAGNOSIS — Z3041 Encounter for surveillance of contraceptive pills: Secondary | ICD-10-CM

## 2016-04-29 NOTE — Telephone Encounter (Signed)
Patient wants to get a refill on her birth control pills.

## 2016-05-04 MED ORDER — LEVONORGESTREL-ETHINYL ESTRAD 0.1-20 MG-MCG PO TABS: 1.0000 | ORAL_TABLET | Freq: Every day | ORAL | 2 refills | Status: DC

## 2016-05-04 NOTE — Telephone Encounter (Signed)
3 month refill sent in per protocol. Called patient, stating we are returning your phone call. We sent in a few refills, but for a year supply you will need to come in for an annual exam. If you have any questions you may call us back.

## 2016-06-25 ENCOUNTER — Encounter: Payer: Self-pay | Admitting: Obstetrics & Gynecology

## 2016-06-25 ENCOUNTER — Ambulatory Visit (INDEPENDENT_AMBULATORY_CARE_PROVIDER_SITE_OTHER): Payer: Self-pay | Admitting: Obstetrics & Gynecology

## 2016-06-25 VITALS — BP 110/65 | HR 76 | Resp 16 | Ht 65.0 in | Wt 130.0 lb

## 2016-06-25 DIAGNOSIS — Z87410 Personal history of cervical dysplasia: Secondary | ICD-10-CM

## 2016-06-25 DIAGNOSIS — Z124 Encounter for screening for malignant neoplasm of cervix: Secondary | ICD-10-CM

## 2016-06-25 DIAGNOSIS — Z01419 Encounter for gynecological examination (general) (routine) without abnormal findings: Secondary | ICD-10-CM

## 2016-06-25 DIAGNOSIS — Z1151 Encounter for screening for human papillomavirus (HPV): Secondary | ICD-10-CM

## 2016-06-25 DIAGNOSIS — Z3041 Encounter for surveillance of contraceptive pills: Secondary | ICD-10-CM

## 2016-06-25 MED ORDER — LEVONORGESTREL-ETHINYL ESTRAD 0.1-20 MG-MCG PO TABS: 1.0000 | ORAL_TABLET | Freq: Every day | ORAL | 11 refills | Status: DC

## 2016-06-25 NOTE — Progress Notes (Signed)
GYNECOLOGY ANNUAL PREVENTATIVE CARE ENCOUNTER NOTE  Subjective:   Ariel Stephens is a 25 y.o. G0P0 female here for a routine annual gynecologic exam.  Current complaints: none.  Desires OCP refill.  Denies abnormal vaginal bleeding, discharge, pelvic pain, problems with intercourse or other gynecologic concerns.    Gynecologic History Patient's last menstrual period was 05/27/2016. Contraception: OCP (estrogen/progesterone) Last Pap: 2015. Results were: normal History of cervical dysplasia. Had HGSIL pap smear; colposcopy then LEEP in 2013 that showed CIN III, focal adenocarcinoma in situ on endocervical margin. Normal paps since then.   Obstetric History OB History  Gravida Para Term Preterm AB Living  0            SAB TAB Ectopic Multiple Live Births                  Patient Active Problem List   Diagnosis Date Noted  . History of severe cervical dysplasia 06/25/2016    Past Medical History:  Diagnosis Date  . 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 Was focal on endocervical margin. Three subsequent pap smears were normal.  . CIN III (cervical intraepithelial neoplasia III) 04/23/2011    Past Surgical History:  Procedure Laterality Date  . WISDOM TOOTH EXTRACTION      No current outpatient prescriptions on file prior to visit.   No current facility-administered medications on file prior to visit.     Allergies  Allergen Reactions  . Penicillins Rash    Social History   Social History  . Marital status: Married    Spouse name: N/A  . Number of children: N/A  . Years of education: N/A   Occupational History  . Not on file.   Social History Main Topics  . Smoking status: Former Smoker    Quit date: 09/14/2009  . Smokeless tobacco: Never Used  . Alcohol use No  . Drug use: No  . Sexual activity: Yes    Birth control/ protection: Pill   Other Topics Concern  . Not on file   Social History Narrative  . No  narrative on file    Family History  Problem Relation Age of Onset  . Heart attack Father   . Stroke Father   . Clotting disorder Father   . Hypertension Mother     The following portions of the patient's history were reviewed and updated as appropriate: allergies, current medications, past family history, past medical history, past social history, past surgical history and problem list.  Review of Systems Pertinent items noted in HPI and remainder of comprehensive ROS otherwise negative.   Objective:  BP 110/65   Pulse 76   Resp 16   Ht 5\' 5"  (1.651 m)   Wt 130 lb (59 kg)   LMP 05/27/2016   BMI 21.63 kg/m  CONSTITUTIONAL: Well-developed, well-nourished female in no acute distress.  HENT:  Normocephalic, atraumatic, External right and left ear normal. Oropharynx is clear and moist EYES: Conjunctivae and EOM are normal. Pupils are equal, round, and reactive to light. No scleral icterus.  NECK: Normal range of motion, supple, no masses.  Normal thyroid.  SKIN: Skin is warm and dry. No rash noted. Not diaphoretic. No erythema. No pallor. NEUROLOGIC: Alert and oriented to person, place, and time. Normal reflexes, muscle tone coordination. No cranial nerve deficit noted. PSYCHIATRIC: Normal mood and affect. Normal behavior. Normal judgment and thought content. CARDIOVASCULAR: Normal heart rate noted, regular rhythm RESPIRATORY: Clear to  auscultation bilaterally. Effort and breath sounds normal, no problems with respiration noted. BREASTS: Symmetric in size. No masses, skin changes, nipple drainage, or lymphadenopathy. ABDOMEN: Soft, normal bowel sounds, no distention noted.  No tenderness, rebound or guarding.  PELVIC: Normal appearing external genitalia; normal appearing vaginal mucosa and cervix. Cervix with ?Nabothian cysts on surface.  No abnormal discharge noted.  Pap smear obtained.  Normal uterine size, no other palpable masses, no uterine or adnexal  tenderness. MUSCULOSKELETAL: Normal range of motion. No tenderness.  No cyanosis, clubbing, or edema.  2+ distal pulses.   Assessment:  Annual gynecologic examination with pap smear History of cervical dysplasia OCP surveillance   Plan:  Will follow up results of pap smear and manage accordingly. OCPs refilled Routine preventative health maintenance measures emphasized. Please refer to After Visit Summary for other counseling recommendations.    Jaynie CollinsUGONNA  ANYANWU, MD, FACOG Attending Obstetrician & Gynecologist, [facility] and [facility]

## 2016-06-25 NOTE — Patient Instructions (Signed)
Preventive Care for Adults, Female A healthy lifestyle and preventive care can promote health and wellness. Preventive health guidelines for women include the following key practices.  A routine yearly physical is a good way to check with your health care provider about your health and preventive screening. It is a chance to share any concerns and updates on your health and to receive a thorough exam.  Visit your dentist for a routine exam and preventive care every 6 months. Brush your teeth twice a day and floss once a day. Good oral hygiene prevents tooth decay and gum disease.  The frequency of eye exams is based on your age, health, family medical history, use of contact lenses, and other factors. Follow your health care provider's recommendations for frequency of eye exams.  Eat a healthy diet. Foods like vegetables, fruits, whole grains, low-fat dairy products, and lean protein foods contain the nutrients you need without too many calories. Decrease your intake of foods high in solid fats, added sugars, and salt. Eat the right amount of calories for you.Get information about a proper diet from your health care provider, if necessary.  Regular physical exercise is one of the most important things you can do for your health. Most adults should get at least 150 minutes of moderate-intensity exercise (any activity that increases your heart rate and causes you to sweat) each week. In addition, most adults need muscle-strengthening exercises on 2 or more days a week.  Maintain a healthy weight. The body mass index (BMI) is a screening tool to identify possible weight problems. It provides an estimate of body fat based on height and weight. Your health care provider can find your BMI and can help you achieve or maintain a healthy weight.For adults 20 years and older:  A BMI below 18.5 is considered underweight.  A BMI of 18.5 to 24.9 is normal.  A BMI of 25 to 29.9 is considered overweight.  A  BMI of 30 and above is considered obese.  Maintain normal blood lipids and cholesterol levels by exercising and minimizing your intake of saturated fat. Eat a balanced diet with plenty of fruit and vegetables. Blood tests for lipids and cholesterol should begin at age 45 and be repeated every 5 years. If your lipid or cholesterol levels are high, you are over 50, or you are at high risk for heart disease, you may need your cholesterol levels checked more frequently.Ongoing high lipid and cholesterol levels should be treated with medicines if diet and exercise are not working.  If you smoke, find out from your health care provider how to quit. If you do not use tobacco, do not start.  Lung cancer screening is recommended for adults aged 45-80 years who are at high risk for developing lung cancer because of a history of smoking. A yearly low-dose CT scan of the lungs is recommended for people who have at least a 30-pack-year history of smoking and are a current smoker or have quit within the past 15 years. A pack year of smoking is smoking an average of 1 pack of cigarettes a day for 1 year (for example: 1 pack a day for 30 years or 2 packs a day for 15 years). Yearly screening should continue until the smoker has stopped smoking for at least 15 years. Yearly screening should be stopped for people who develop a health problem that would prevent them from having lung cancer treatment.  If you are pregnant, do not drink alcohol. If you are  breastfeeding, be very cautious about drinking alcohol. If you are not pregnant and choose to drink alcohol, do not have more than 1 drink per day. One drink is considered to be 12 ounces (355 mL) of beer, 5 ounces (148 mL) of wine, or 1.5 ounces (44 mL) of liquor.  Avoid use of street drugs. Do not share needles with anyone. Ask for help if you need support or instructions about stopping the use of drugs.  High blood pressure causes heart disease and increases the risk  of stroke. Your blood pressure should be checked at least every 1 to 2 years. Ongoing high blood pressure should be treated with medicines if weight loss and exercise do not work.  If you are 55-79 years old, ask your health care provider if you should take aspirin to prevent strokes.  Diabetes screening is done by taking a blood sample to check your blood glucose level after you have not eaten for a certain period of time (fasting). If you are not overweight and you do not have risk factors for diabetes, you should be screened once every 3 years starting at age 45. If you are overweight or obese and you are 40-70 years of age, you should be screened for diabetes every year as part of your cardiovascular risk assessment.  Breast cancer screening is essential preventive care for women. You should practice "breast self-awareness." This means understanding the normal appearance and feel of your breasts and may include breast self-examination. Any changes detected, no matter how small, should be reported to a health care provider. Women in their 20s and 30s should have a clinical breast exam (CBE) by a health care provider as part of a regular health exam every 1 to 3 years. After age 40, women should have a CBE every year. Starting at age 40, women should consider having a mammogram (breast X-ray test) every year. Women who have a family history of breast cancer should talk to their health care provider about genetic screening. Women at a high risk of breast cancer should talk to their health care providers about having an MRI and a mammogram every year.  Breast cancer gene (BRCA)-related cancer risk assessment is recommended for women who have family members with BRCA-related cancers. BRCA-related cancers include breast, ovarian, tubal, and peritoneal cancers. Having family members with these cancers may be associated with an increased risk for harmful changes (mutations) in the breast cancer genes BRCA1 and  BRCA2. Results of the assessment will determine the need for genetic counseling and BRCA1 and BRCA2 testing.  Your health care provider may recommend that you be screened regularly for cancer of the pelvic organs (ovaries, uterus, and vagina). This screening involves a pelvic examination, including checking for microscopic changes to the surface of your cervix (Pap test). You may be encouraged to have this screening done every 3 years, beginning at age 21.  For women ages 30-65, health care providers may recommend pelvic exams and Pap testing every 3 years, or they may recommend the Pap and pelvic exam, combined with testing for human papilloma virus (HPV), every 5 years. Some types of HPV increase your risk of cervical cancer. Testing for HPV may also be done on women of any age with unclear Pap test results.  Other health care providers may not recommend any screening for nonpregnant women who are considered low risk for pelvic cancer and who do not have symptoms. Ask your health care provider if a screening pelvic exam is right for   you.  If you have had past treatment for cervical cancer or a condition that could lead to cancer, you need Pap tests and screening for cancer for at least 20 years after your treatment. If Pap tests have been discontinued, your risk factors (such as having a new sexual partner) need to be reassessed to determine if screening should resume. Some women have medical problems that increase the chance of getting cervical cancer. In these cases, your health care provider may recommend more frequent screening and Pap tests.  Colorectal cancer can be detected and often prevented. Most routine colorectal cancer screening begins at the age of 50 years and continues through age 75 years. However, your health care provider may recommend screening at an earlier age if you have risk factors for colon cancer. On a yearly basis, your health care provider may provide home test kits to check  for hidden blood in the stool. Use of a small camera at the end of a tube, to directly examine the colon (sigmoidoscopy or colonoscopy), can detect the earliest forms of colorectal cancer. Talk to your health care provider about this at age 50, when routine screening begins. Direct exam of the colon should be repeated every 5-10 years through age 75 years, unless early forms of precancerous polyps or small growths are found.  People who are at an increased risk for hepatitis B should be screened for this virus. You are considered at high risk for hepatitis B if:  You were born in a country where hepatitis B occurs often. Talk with your health care provider about which countries are considered high risk.  Your parents were born in a high-risk country and you have not received a shot to protect against hepatitis B (hepatitis B vaccine).  You have HIV or AIDS.  You use needles to inject street drugs.  You live with, or have sex with, someone who has hepatitis B.  You get hemodialysis treatment.  You take certain medicines for conditions like cancer, organ transplantation, and autoimmune conditions.  Hepatitis C blood testing is recommended for all people born from 1945 through 1965 and any individual with known risks for hepatitis C.  Practice safe sex. Use condoms and avoid high-risk sexual practices to reduce the spread of sexually transmitted infections (STIs). STIs include gonorrhea, chlamydia, syphilis, trichomonas, herpes, HPV, and human immunodeficiency virus (HIV). Herpes, HIV, and HPV are viral illnesses that have no cure. They can result in disability, cancer, and death.  You should be screened for sexually transmitted illnesses (STIs) including gonorrhea and chlamydia if:  You are sexually active and are younger than 24 years.  You are older than 24 years and your health care provider tells you that you are at risk for this type of infection.  Your sexual activity has changed  since you were last screened and you are at an increased risk for chlamydia or gonorrhea. Ask your health care provider if you are at risk.  If you are at risk of being infected with HIV, it is recommended that you take a prescription medicine daily to prevent HIV infection. This is called preexposure prophylaxis (PrEP). You are considered at risk if:  You are sexually active and do not regularly use condoms or know the HIV status of your partner(s).  You take drugs by injection.  You are sexually active with a partner who has HIV.  Talk with your health care provider about whether you are at high risk of being infected with HIV. If   you choose to begin PrEP, you should first be tested for HIV. You should then be tested every 3 months for as long as you are taking PrEP.  Osteoporosis is a disease in which the bones lose minerals and strength with aging. This can result in serious bone fractures or breaks. The risk of osteoporosis can be identified using a bone density scan. Women ages 67 years and over and women at risk for fractures or osteoporosis should discuss screening with their health care providers. Ask your health care provider whether you should take a calcium supplement or vitamin D to reduce the rate of osteoporosis.  Menopause can be associated with physical symptoms and risks. Hormone replacement therapy is available to decrease symptoms and risks. You should talk to your health care provider about whether hormone replacement therapy is right for you.  Use sunscreen. Apply sunscreen liberally and repeatedly throughout the day. You should seek shade when your shadow is shorter than you. Protect yourself by wearing long sleeves, pants, a wide-brimmed hat, and sunglasses year round, whenever you are outdoors.  Once a month, do a whole body skin exam, using a mirror to look at the skin on your back. Tell your health care provider of new moles, moles that have irregular borders, moles that  are larger than a pencil eraser, or moles that have changed in shape or color.  Stay current with required vaccines (immunizations).  Influenza vaccine. All adults should be immunized every year.  Tetanus, diphtheria, and acellular pertussis (Td, Tdap) vaccine. Pregnant women should receive 1 dose of Tdap vaccine during each pregnancy. The dose should be obtained regardless of the length of time since the last dose. Immunization is preferred during the 27th-36th week of gestation. An adult who has not previously received Tdap or who does not know her vaccine status should receive 1 dose of Tdap. This initial dose should be followed by tetanus and diphtheria toxoids (Td) booster doses every 10 years. Adults with an unknown or incomplete history of completing a 3-dose immunization series with Td-containing vaccines should begin or complete a primary immunization series including a Tdap dose. Adults should receive a Td booster every 10 years.  Varicella vaccine. An adult without evidence of immunity to varicella should receive 2 doses or a second dose if she has previously received 1 dose. Pregnant females who do not have evidence of immunity should receive the first dose after pregnancy. This first dose should be obtained before leaving the health care facility. The second dose should be obtained 4-8 weeks after the first dose.  Human papillomavirus (HPV) vaccine. Females aged 13-26 years who have not received the vaccine previously should obtain the 3-dose series. The vaccine is not recommended for use in pregnant females. However, pregnancy testing is not needed before receiving a dose. If a female is found to be pregnant after receiving a dose, no treatment is needed. In that case, the remaining doses should be delayed until after the pregnancy. Immunization is recommended for any person with an immunocompromised condition through the age of 61 years if she did not get any or all doses earlier. During the  3-dose series, the second dose should be obtained 4-8 weeks after the first dose. The third dose should be obtained 24 weeks after the first dose and 16 weeks after the second dose.  Zoster vaccine. One dose is recommended for adults aged 30 years or older unless certain conditions are present.  Measles, mumps, and rubella (MMR) vaccine. Adults born  before 1957 generally are considered immune to measles and mumps. Adults born in 1957 or later should have 1 or more doses of MMR vaccine unless there is a contraindication to the vaccine or there is laboratory evidence of immunity to each of the three diseases. A routine second dose of MMR vaccine should be obtained at least 28 days after the first dose for students attending postsecondary schools, health care workers, or international travelers. People who received inactivated measles vaccine or an unknown type of measles vaccine during 1963-1967 should receive 2 doses of MMR vaccine. People who received inactivated mumps vaccine or an unknown type of mumps vaccine before 1979 and are at high risk for mumps infection should consider immunization with 2 doses of MMR vaccine. For females of childbearing age, rubella immunity should be determined. If there is no evidence of immunity, females who are not pregnant should be vaccinated. If there is no evidence of immunity, females who are pregnant should delay immunization until after pregnancy. Unvaccinated health care workers born before 1957 who lack laboratory evidence of measles, mumps, or rubella immunity or laboratory confirmation of disease should consider measles and mumps immunization with 2 doses of MMR vaccine or rubella immunization with 1 dose of MMR vaccine.  Pneumococcal 13-valent conjugate (PCV13) vaccine. When indicated, a person who is uncertain of his immunization history and has no record of immunization should receive the PCV13 vaccine. All adults 65 years of age and older should receive this  vaccine. An adult aged 19 years or older who has certain medical conditions and has not been previously immunized should receive 1 dose of PCV13 vaccine. This PCV13 should be followed with a dose of pneumococcal polysaccharide (PPSV23) vaccine. Adults who are at high risk for pneumococcal disease should obtain the PPSV23 vaccine at least 8 weeks after the dose of PCV13 vaccine. Adults older than 26 years of age who have normal immune system function should obtain the PPSV23 vaccine dose at least 1 year after the dose of PCV13 vaccine.  Pneumococcal polysaccharide (PPSV23) vaccine. When PCV13 is also indicated, PCV13 should be obtained first. All adults aged 65 years and older should be immunized. An adult younger than age 65 years who has certain medical conditions should be immunized. Any person who resides in a nursing home or long-term care facility should be immunized. An adult smoker should be immunized. People with an immunocompromised condition and certain other conditions should receive both PCV13 and PPSV23 vaccines. People with human immunodeficiency virus (HIV) infection should be immunized as soon as possible after diagnosis. Immunization during chemotherapy or radiation therapy should be avoided. Routine use of PPSV23 vaccine is not recommended for American Indians, [place] Natives, or people younger than 65 years unless there are medical conditions that require PPSV23 vaccine. When indicated, people who have unknown immunization and have no record of immunization should receive PPSV23 vaccine. One-time revaccination 5 years after the first dose of PPSV23 is recommended for people aged 19-64 years who have chronic kidney failure, nephrotic syndrome, asplenia, or immunocompromised conditions. People who received 1-2 doses of PPSV23 before age 65 years should receive another dose of PPSV23 vaccine at age 65 years or later if at least 5 years have passed since the previous dose. Doses of PPSV23 are not  needed for people immunized with PPSV23 at or after age 65 years.  Meningococcal vaccine. Adults with asplenia or persistent complement component deficiencies should receive 2 doses of quadrivalent meningococcal conjugate (MenACWY-D) vaccine. The doses should be obtained   at least 2 months apart. Microbiologists working with certain meningococcal bacteria, [place] recruits, people at risk during an outbreak, and people who travel to or live in countries with a high rate of meningitis should be immunized. A first-year college student up through age 34 years who is living in a residence hall should receive a dose if she did not receive a dose on or after her 16th birthday. Adults who have certain high-risk conditions should receive one or more doses of vaccine.  Hepatitis A vaccine. Adults who wish to be protected from this disease, have certain high-risk conditions, work with hepatitis A-infected animals, work in hepatitis A research labs, or travel to or work in countries with a high rate of hepatitis A should be immunized. Adults who were previously unvaccinated and who anticipate close contact with an international adoptee during the first 60 days after arrival in the [place] from a country with a high rate of hepatitis A should be immunized.  Hepatitis B vaccine. Adults who wish to be protected from this disease, have certain high-risk conditions, may be exposed to blood or other infectious body fluids, are household contacts or sex partners of hepatitis B positive people, are clients or workers in certain care facilities, or travel to or work in countries with a high rate of hepatitis B should be immunized.  Haemophilus influenzae type b (Hib) vaccine. A previously unvaccinated person with asplenia or sickle cell disease or having a scheduled splenectomy should receive 1 dose of Hib vaccine. Regardless of previous immunization, a recipient of a hematopoietic stem cell transplant should receive a  3-dose series 6-12 months after her successful transplant. Hib vaccine is not recommended for adults with HIV infection. Preventive Services / Frequency Ages 35 to 4 years  Blood pressure check.** / Every 3-5 years.  Lipid and cholesterol check.** / Every 5 years beginning at age 60.  Clinical breast exam.** / Every 3 years for women in their 71s and 10s.  BRCA-related cancer risk assessment.** / For women who have family members with a BRCA-related cancer (breast, ovarian, tubal, or peritoneal cancers).  Pap test.** / Every 2 years from ages 76 through 26. Every 3 years starting at age 61 through age 76 or 93 with a history of 3 consecutive normal Pap tests.  HPV screening.** / Every 3 years from ages 37 through ages 60 to 51 with a history of 3 consecutive normal Pap tests.  Hepatitis C blood test.** / For any individual with known risks for hepatitis C.  Skin self-exam. / Monthly.  Influenza vaccine. / Every year.  Tetanus, diphtheria, and acellular pertussis (Tdap, Td) vaccine.** / Consult your health care provider. Pregnant women should receive 1 dose of Tdap vaccine during each pregnancy. 1 dose of Td every 10 years.  Varicella vaccine.** / Consult your health care provider. Pregnant females who do not have evidence of immunity should receive the first dose after pregnancy.  HPV vaccine. / 3 doses over 6 months, if 93 and younger. The vaccine is not recommended for use in pregnant females. However, pregnancy testing is not needed before receiving a dose.  Measles, mumps, rubella (MMR) vaccine.** / You need at least 1 dose of MMR if you were born in 1957 or later. You may also need a 2nd dose. For females of childbearing age, rubella immunity should be determined. If there is no evidence of immunity, females who are not pregnant should be vaccinated. If there is no evidence of immunity, females who are  pregnant should delay immunization until after pregnancy.  Pneumococcal  13-valent conjugate (PCV13) vaccine.** / Consult your health care provider.  Pneumococcal polysaccharide (PPSV23) vaccine.** / 1 to 2 doses if you smoke cigarettes or if you have certain conditions.  Meningococcal vaccine.** / 1 dose if you are age 68 to 8 years and a Market researcher living in a residence hall, or have one of several medical conditions, you need to get vaccinated against meningococcal disease. You may also need additional booster doses.  Hepatitis A vaccine.** / Consult your health care provider.  Hepatitis B vaccine.** / Consult your health care provider.  Haemophilus influenzae type b (Hib) vaccine.** / Consult your health care provider. Ages 7 to 53 years  Blood pressure check.** / Every year.  Lipid and cholesterol check.** / Every 5 years beginning at age 25 years.  Lung cancer screening. / Every year if you are aged 11-80 years and have a 30-pack-year history of smoking and currently smoke or have quit within the past 15 years. Yearly screening is stopped once you have quit smoking for at least 15 years or develop a health problem that would prevent you from having lung cancer treatment.  Clinical breast exam.** / Every year after age 48 years.  BRCA-related cancer risk assessment.** / For women who have family members with a BRCA-related cancer (breast, ovarian, tubal, or peritoneal cancers).  Mammogram.** / Every year beginning at age 41 years and continuing for as long as you are in good health. Consult with your health care provider.  Pap test.** / Every 3 years starting at age 65 years through age 37 or 70 years with a history of 3 consecutive normal Pap tests.  HPV screening.** / Every 3 years from ages 72 years through ages 60 to 40 years with a history of 3 consecutive normal Pap tests.  Fecal occult blood test (FOBT) of stool. / Every year beginning at age 21 years and continuing until age 5 years. You may not need to do this test if you get  a colonoscopy every 10 years.  Flexible sigmoidoscopy or colonoscopy.** / Every 5 years for a flexible sigmoidoscopy or every 10 years for a colonoscopy beginning at age 35 years and continuing until age 48 years.  Hepatitis C blood test.** / For all people born from [birth year] through 1965 and any individual with known risks for hepatitis C.  Skin self-exam. / Monthly.  Influenza vaccine. / Every year.  Tetanus, diphtheria, and acellular pertussis (Tdap/Td) vaccine.** / Consult your health care provider. Pregnant women should receive 1 dose of Tdap vaccine during each pregnancy. 1 dose of Td every 10 years.  Varicella vaccine.** / Consult your health care provider. Pregnant females who do not have evidence of immunity should receive the first dose after pregnancy.  Zoster vaccine.** / 1 dose for adults aged 30 years or older.  Measles, mumps, rubella (MMR) vaccine.** / You need at least 1 dose of MMR if you were born in 1957 or later. You may also need a second dose. For females of childbearing age, rubella immunity should be determined. If there is no evidence of immunity, females who are not pregnant should be vaccinated. If there is no evidence of immunity, females who are pregnant should delay immunization until after pregnancy.  Pneumococcal 13-valent conjugate (PCV13) vaccine.** / Consult your health care provider.  Pneumococcal polysaccharide (PPSV23) vaccine.** / 1 to 2 doses if you smoke cigarettes or if you have certain conditions.  Meningococcal vaccine.** /  Consult your health care provider.  Hepatitis A vaccine.** / Consult your health care provider.  Hepatitis B vaccine.** / Consult your health care provider.  Haemophilus influenzae type b (Hib) vaccine.** / Consult your health care provider. Ages 64 years and over  Blood pressure check.** / Every year.  Lipid and cholesterol check.** / Every 5 years beginning at age 23 years.  Lung cancer screening. / Every year if you  are aged 16-80 years and have a 30-pack-year history of smoking and currently smoke or have quit within the past 15 years. Yearly screening is stopped once you have quit smoking for at least 15 years or develop a health problem that would prevent you from having lung cancer treatment.  Clinical breast exam.** / Every year after age 74 years.  BRCA-related cancer risk assessment.** / For women who have family members with a BRCA-related cancer (breast, ovarian, tubal, or peritoneal cancers).  Mammogram.** / Every year beginning at age 44 years and continuing for as long as you are in good health. Consult with your health care provider.  Pap test.** / Every 3 years starting at age 58 years through age 22 or 39 years with 3 consecutive normal Pap tests. Testing can be stopped between 65 and 70 years with 3 consecutive normal Pap tests and no abnormal Pap or HPV tests in the past 10 years.  HPV screening.** / Every 3 years from ages 64 years through ages 70 or 61 years with a history of 3 consecutive normal Pap tests. Testing can be stopped between 65 and 70 years with 3 consecutive normal Pap tests and no abnormal Pap or HPV tests in the past 10 years.  Fecal occult blood test (FOBT) of stool. / Every year beginning at age 40 years and continuing until age 27 years. You may not need to do this test if you get a colonoscopy every 10 years.  Flexible sigmoidoscopy or colonoscopy.** / Every 5 years for a flexible sigmoidoscopy or every 10 years for a colonoscopy beginning at age 7 years and continuing until age 32 years.  Hepatitis C blood test.** / For all people born from [birth year] through 1966 and any individual with known risks for hepatitis C.  Osteoporosis screening.** / A one-time screening for women ages 30 years and over and women at risk for fractures or osteoporosis.  Skin self-exam. / Monthly.  Influenza vaccine. / Every year.  Tetanus, diphtheria, and acellular pertussis (Tdap/Td)  vaccine.** / 1 dose of Td every 10 years.  Varicella vaccine.** / Consult your health care provider.  Zoster vaccine.** / 1 dose for adults aged 35 years or older.  Pneumococcal 13-valent conjugate (PCV13) vaccine.** / Consult your health care provider.  Pneumococcal polysaccharide (PPSV23) vaccine.** / 1 dose for all adults aged 46 years and older.  Meningococcal vaccine.** / Consult your health care provider.  Hepatitis A vaccine.** / Consult your health care provider.  Hepatitis B vaccine.** / Consult your health care provider.  Haemophilus influenzae type b (Hib) vaccine.** / Consult your health care provider. ** Family history and personal history of risk and conditions may change your health care provider's recommendations.   This information is not intended to replace advice given to you by your health care provider. Make sure you discuss any questions you have with your health care provider.   Document Released: 10/27/2001 Document Revised: 09/21/2014 Document Reviewed: 01/26/2011 Elsevier Interactive Patient Education [employer].

## 2016-07-01 LAB — CYTOLOGY - PAP
Adequacy: ABSENT
Diagnosis: NEGATIVE
HPV: NOT DETECTED

## 2017-07-12 ENCOUNTER — Other Ambulatory Visit: Payer: Self-pay | Admitting: Obstetrics & Gynecology

## 2017-07-12 ENCOUNTER — Ambulatory Visit: Payer: Self-pay | Admitting: Obstetrics & Gynecology

## 2017-07-12 DIAGNOSIS — Z3041 Encounter for surveillance of contraceptive pills: Secondary | ICD-10-CM

## 2017-07-12 MED ORDER — LEVONORGESTREL-ETHINYL ESTRAD 0.1-20 MG-MCG PO TABS: 1.0000 | ORAL_TABLET | Freq: Every day | ORAL | 11 refills | Status: DC

## 2018-08-16 ENCOUNTER — Telehealth: Payer: Self-pay

## 2018-08-16 DIAGNOSIS — Z3041 Encounter for surveillance of contraceptive pills: Secondary | ICD-10-CM

## 2018-08-16 MED ORDER — LEVONORGESTREL-ETHINYL ESTRAD 0.1-20 MG-MCG PO TABS: 1.0000 | ORAL_TABLET | Freq: Every day | ORAL | 0 refills | Status: DC

## 2018-08-16 NOTE — Telephone Encounter (Signed)
Pt called needing refill of OCP. Pt is due for annual so we will send one refill. Annual appt scheduled.

## 2018-08-25 ENCOUNTER — Ambulatory Visit: Payer: Self-pay | Admitting: Obstetrics & Gynecology

## 2018-08-25 ENCOUNTER — Encounter: Payer: Self-pay | Admitting: Obstetrics & Gynecology

## 2018-08-25 VITALS — BP 107/70 | HR 80 | Resp 16 | Ht 65.0 in | Wt 144.0 lb

## 2018-08-25 DIAGNOSIS — Z124 Encounter for screening for malignant neoplasm of cervix: Secondary | ICD-10-CM

## 2018-08-25 DIAGNOSIS — Z01419 Encounter for gynecological examination (general) (routine) without abnormal findings: Secondary | ICD-10-CM

## 2018-08-25 DIAGNOSIS — Z3041 Encounter for surveillance of contraceptive pills: Secondary | ICD-10-CM

## 2018-08-25 MED ORDER — LEVONORGESTREL-ETHINYL ESTRAD 0.1-20 MG-MCG PO TABS: 1.0000 | ORAL_TABLET | Freq: Every day | ORAL | 12 refills | Status: DC

## 2018-08-25 NOTE — Progress Notes (Signed)
Subjective:    Natalia LeatherwoodKathleen D Yamamoto is a 27 y.o. widowed this year P0 female who presents for an annual exam. The patient has no complaints today. The patient is sexually active. GYN screening history: last pap: was normal. The patient wears seatbelts: yes. The patient participates in regular exercise: yes.( [place]  Has the patient ever been transfused or tattooed?: no. The patient reports that there is not domestic violence in her life.   Menstrual History: OB History    Gravida  0   Para      Term      Preterm      AB      Living        SAB      TAB      Ectopic      Multiple      Live Births              Menarche age: [age] Patient's last menstrual period was 08/19/2018.    The following portions of the patient's history were reviewed and updated as appropriate: allergies, current medications, past family history, past medical history, past social history, past surgical history and problem list.  Review of Systems Pertinent items are noted in HPI.   Monogamous for about 6 weeks Works at [employer] [place]- no breast/gyn/colon cancer, + heart disease   Objective:    BP 107/70   Pulse 80   Resp 16   Ht 5\' 5"  (1.651 m)   Wt 144 lb (65.3 kg)   LMP 08/19/2018   BMI 23.96 kg/m   General Appearance:    Alert, cooperative, no distress, appears stated age  Head:    Normocephalic, without obvious abnormality, atraumatic  Eyes:    PERRL, conjunctiva/corneas clear, EOM's intact, fundi    benign, both eyes  Ears:    Normal TM's and external ear canals, both ears  Nose:   Nares normal, septum midline, mucosa normal, no drainage    or sinus tenderness  Throat:   Lips, mucosa, and tongue normal; teeth and gums normal  Neck:   Supple, symmetrical, trachea midline, no adenopathy;    thyroid:  no enlargement/tenderness/nodules; no carotid   bruit or JVD  Back:     Symmetric, no curvature, ROM normal, no CVA tenderness  Lungs:     Clear  to auscultation bilaterally, respirations unlabored  Chest Wall:    No tenderness or deformity   Heart:    Regular rate and rhythm, S1 and S2 normal, no murmur, rub   or gallop  Breast Exam:    No tenderness, masses, or nipple abnormality  Abdomen:     Soft, non-tender, bowel sounds active all four quadrants,    no masses, no organomegaly  Genitalia:    Normal female without lesion, discharge or tenderness, normal size and shape, retroverted, mobile, non-tender, normal adnexal exam      Extremities:   Extremities normal, atraumatic, no cyanosis or edema  Pulses:   2+ and symmetric all extremities  Skin:   Skin color, texture, turgor normal, no rashes or lesions  Lymph nodes:   Cervical, supraclavicular, and axillary nodes normal  Neurologic:   CNII-XII intact, normal strength, sensation and reflexes    throughout  .    Assessment:    Healthy female exam.    Plan:     Thin prep Pap smear.   Rec Gardasil at health dept She may have refills of OCPs for  3 years without a visit.

## 2018-08-25 NOTE — Patient Instructions (Signed)
Human Papillomavirus Quadrivalent Vaccine suspension for injection What is this medicine? HUMAN PAPILLOMAVIRUS VACCINE (HYOO muhn pap uh LOH muh vahy ruhs vak SEEN) is a vaccine. It is used to prevent infections of four types of the human papillomavirus. In women, the vaccine may lower your risk of getting cervical, vaginal, vulvar, or anal cancer and genital warts. In men, the vaccine may lower your risk of getting genital warts and anal cancer. You cannot get these diseases from the vaccine. This vaccine does not treat these diseases. This medicine may be used for other purposes; ask your health care provider or pharmacist if you have questions. COMMON BRAND NAME(S): Gardasil What should I tell my health care provider before I take this medicine? They need to know if you have any of these conditions: -fever or infection -hemophilia -HIV infection or AIDS -immune system problems -low platelet count -an unusual reaction to Human Papillomavirus Vaccine, yeast, other medicines, foods, dyes, or preservatives -pregnant or trying to get pregnant -breast-feeding How should I use this medicine? This vaccine is for injection in a muscle on your upper arm or thigh. It is given by a health care professional. Dennis Bast will be observed for 15 minutes after each dose. Sometimes, fainting happens after the vaccine is given. You may be asked to sit or lie down during the 15 minutes. Three doses are given. The second dose is given 2 months after the first dose. The last dose is given 4 months after the second dose. A copy of a Vaccine Information Statement will be given before each vaccination. Read this sheet carefully each time. The sheet may change frequently. Talk to your pediatrician regarding the use of this medicine in children. While this drug may be prescribed for children as young as 11 years of age for selected conditions, precautions do apply. Overdosage: If you think you have taken too much of this  medicine contact a poison control center or emergency room at once. NOTE: This medicine is only for you. Do not share this medicine with others. What if I miss a dose? All 3 doses of the vaccine should be given within 6 months. Remember to keep appointments for follow-up doses. Your health care provider will tell you when to return for the next vaccine. Ask your health care professional for advice if you are unable to keep an appointment or miss a scheduled dose. What may interact with this medicine? -other vaccines This list may not describe all possible interactions. Give your health care provider a list of all the medicines, herbs, non-prescription drugs, or dietary supplements you use. Also tell them if you smoke, drink alcohol, or use illegal drugs. Some items may interact with your medicine. What should I watch for while using this medicine? This vaccine may not fully protect everyone. Continue to have regular pelvic exams and cervical or anal cancer screenings as directed by your doctor. The Human Papillomavirus is a sexually transmitted disease. It can be passed by any kind of sexual activity that involves genital contact. The vaccine works best when given before you have any contact with the virus. Many people who have the virus do not have any signs or symptoms. Tell your doctor or health care professional if you have any reaction or unusual symptom after getting the vaccine. What side effects may I notice from receiving this medicine? Side effects that you should report to your doctor or health care professional as soon as possible: -allergic reactions like skin rash, itching or hives, swelling  of the face, lips, or tongue -breathing problems -feeling faint or lightheaded, falls Side effects that usually do not require medical attention (report to your doctor or health care professional if they continue or are bothersome): -cough -dizziness -fever -headache -nausea -redness, warmth,  swelling, pain, or itching at site where injected This list may not describe all possible side effects. Call your doctor for medical advice about side effects. You may report side effects to FDA at 1-800-FDA-1088. Where should I keep my medicine? This drug is given in a hospital or clinic and will not be stored at home. NOTE: This sheet is a summary. It may not cover all possible information. If you have questions about this medicine, talk to your doctor, pharmacist, or health care provider.  2018 Elsevier/Gold Standard (2013-10-23 13:14:33)

## 2018-08-25 NOTE — Addendum Note (Signed)
Addended by: Kathie DikeSOLA, DEANNA J on: 08/25/2018 04:41 PM   Modules accepted: Orders

## 2018-08-30 LAB — CYTOLOGY - PAP
Diagnosis: NEGATIVE
Diagnosis: REACTIVE

## 2019-09-15 NOTE — L&D Delivery Note (Signed)
Delivery Note Pt pushed cumulatively for 2 hours w/ few variables but overall reassuring fetal status. At 10:02 AM a viable and healthy female was delivered via Vaginal, Spontaneous (Presentation: Right Occiput Anterior).  APGAR: 9, 9; weight pending.   Placenta status: Spontaneous, Intact.  Cord: 3 vessels with the following complications: None. No trailing membranes, but some concern for membranes not all being present. Gloves changed and uterus swept. Unable to reach fundus due to uterus being clamped down but no membranes felt and bleeding Nml. Will give Methergine series x 24 hours to aid evacuation of any remaining membranes. Dr. Alysia Penna informed and agrees with POC. Cord pH: NA.   Pt had low grade temp of 100.2 in labor followed by Nml temp. No fetal or maternal tachycardia, purulent or foul-smelling fluid concerning for chorioamnionitis. Immediately after delivery pt had low-grade temp in 100.1. Decision was made to  not place post-placental IUD. Per consult w/ Dr. Alysia Penna will give Unasyn x 24 hours.   Anesthesia: Epidural, local Episiotomy: None Lacerations: 2nd degree Suture Repair: 3.0 vicryl rapide Est. Blood Loss (mL): 325  Mom to postpartum.  Baby to Couplet care / Skin to Skin.  Ariel Stephens 08/18/2020, 10:58 AM

## 2019-10-03 ENCOUNTER — Other Ambulatory Visit: Payer: Self-pay | Admitting: Obstetrics & Gynecology

## 2019-10-03 DIAGNOSIS — Z3041 Encounter for surveillance of contraceptive pills: Secondary | ICD-10-CM

## 2019-10-10 ENCOUNTER — Other Ambulatory Visit: Payer: Self-pay

## 2019-10-10 DIAGNOSIS — Z3041 Encounter for surveillance of contraceptive pills: Secondary | ICD-10-CM

## 2020-01-12 ENCOUNTER — Other Ambulatory Visit: Payer: Self-pay | Admitting: Obstetrics & Gynecology

## 2020-01-12 DIAGNOSIS — Z3041 Encounter for surveillance of contraceptive pills: Secondary | ICD-10-CM

## 2020-02-13 ENCOUNTER — Encounter: Payer: Self-pay | Admitting: *Deleted

## 2020-02-14 ENCOUNTER — Encounter: Payer: Self-pay | Admitting: Obstetrics and Gynecology

## 2020-02-16 ENCOUNTER — Other Ambulatory Visit: Payer: Self-pay

## 2020-02-16 ENCOUNTER — Encounter: Payer: Self-pay | Admitting: Obstetrics and Gynecology

## 2020-02-16 ENCOUNTER — Encounter: Payer: Self-pay | Admitting: *Deleted

## 2020-02-16 ENCOUNTER — Other Ambulatory Visit (HOSPITAL_COMMUNITY)
Admission: RE | Admit: 2020-02-16 | Discharge: 2020-02-16 | Disposition: A | Discharge status: Home / Self Care | Payer: Medicaid Other | Source: Ambulatory Visit | Attending: Obstetrics and Gynecology | Admitting: Obstetrics and Gynecology

## 2020-02-16 ENCOUNTER — Ambulatory Visit (INDEPENDENT_AMBULATORY_CARE_PROVIDER_SITE_OTHER): Payer: Medicaid Other | Admitting: Obstetrics and Gynecology

## 2020-02-16 VITALS — BP 115/71 | HR 92 | Wt 145.0 lb

## 2020-02-16 DIAGNOSIS — Z3400 Encounter for supervision of normal first pregnancy, unspecified trimester: Secondary | ICD-10-CM | POA: Insufficient documentation

## 2020-02-16 DIAGNOSIS — Z3401 Encounter for supervision of normal first pregnancy, first trimester: Secondary | ICD-10-CM

## 2020-02-16 LAB — TSH: TSH: 1.35 mIU/L

## 2020-02-16 NOTE — Progress Notes (Signed)
History:   Ariel Stephens is a 29 y.o. G1P0 at [redacted]w[redacted]d by early ultrasound being seen today for her first obstetrical visit.  Her obstetrical history is significant for healthy female, abnormal pap . Patient does intend to breast feed. Pregnancy history fully reviewed.  Patient reports no complaints. Nausea which has gotten better.  HISTORY: OB History  Gravida Para Term Preterm AB Living  1 0 0 0 0 0  SAB TAB Ectopic Multiple Live Births  0 0 0 0 0    # Outcome Date GA Lbr Len/2nd Weight Sex Delivery Anes PTL Lv  1 Current             Last pap smear was done 2019 and was normal  Past Medical History:  Diagnosis Date  . 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 Was focal on endocervical margin. Three subsequent pap smears were normal.  . CIN III (cervical intraepithelial neoplasia III) 04/23/2011  . Vaginal Pap smear, abnormal    HGSIL   Past Surgical History:  Procedure Laterality Date  . LEEP    . WISDOM TOOTH EXTRACTION     Family History  Problem Relation Age of Onset  . Heart attack Father   . Stroke Father   . Clotting disorder Father   . Hypertension Mother    Social History   Tobacco Use  . Smoking status: Former Smoker    Quit date: 09/14/2009    Years since quitting: 10.4  . Smokeless tobacco: Never Used  Substance Use Topics  . Alcohol use: No  . Drug use: No   Allergies  Allergen Reactions  . Penicillins Rash   Current Outpatient Medications on File Prior to Visit  Medication Sig Dispense Refill  . Prenatal Vit-Fe Fumarate-FA (PRENATAL VITAMIN PO) Take by mouth.     No current facility-administered medications on file prior to visit.    Review of Systems Pertinent items noted in HPI and remainder of comprehensive ROS otherwise negative. Physical Exam:   Vitals:   02/16/20 0812  BP: 115/71  Pulse: 92  Weight: 145 lb (65.8 kg)     Bedside Ultrasound for FHR check: Viable intrauterine pregnancy with positive  cardiac activity noted. Patient informed that the ultrasound is considered a limited obstetric ultrasound and is not intended to be a complete ultrasound exam.  Patient also informed that the ultrasound is not being completed with the intent of assessing for fetal or placental anomalies or any pelvic abnormalities.  Explained that the purpose of today's ultrasound is to assess for fetal heart rate.  Patient acknowledges the purpose of the exam and the limitations of the study.    BP 115/71   Pulse 92   Wt 145 lb (65.8 kg)   LMP 11/27/2019   BMI 24.13 kg/m  Uterine Size: size equals dates  Pelvic Exam:    Perineum: No Hemorrhoids, Normal Perineum   Vulva: normal   Vagina:  normal mucosa, normal discharge, no palpable nodules   pH: Not done   Cervix: no bleeding following Pap, no cervical motion tenderness and no lesions   Adnexa: normal adnexa and no mass, fullness, tenderness   Bony Pelvis: Adequate  System: Breast:  No nipple retraction or dimpling, No nipple discharge or bleeding, No axillary or supraclavicular adenopathy, Normal to palpation without dominant masses   Skin: normal coloration and turgor, no rashes    Neurologic: negative   Extremities: normal strength, tone, and muscle mass  HEENT neck supple with midline trachea and thyroid without masses   Mouth/Teeth mucous membranes moist, pharynx normal without lesions   Neck supple and no masses   Cardiovascular: regular rate and rhythm, no murmurs or gallops   Respiratory:  appears well, vitals normal, no respiratory distress, acyanotic, normal RR, neck free of mass or lymphadenopathy, chest clear, no wheezing, crepitations, rhonchi, normal symmetric air entry   Abdomen: soft, non-tender; bowel sounds normal; no masses,  no organomegaly   Urinary: urethral meatus normal   Assessment:    Pregnancy: G1P0 Patient Active Problem List   Diagnosis Date Noted  . Supervision of normal first pregnancy 02/16/2020  . History of  severe cervical dysplasia 06/25/2016     Plan:    1. Supervision of normal first pregnancy, antepartum  - Obstetric panel - HIV antibody (with reflex) - Hepatitis C Antibody - Culture, OB Urine - Babyscripts Schedule Optimization - Cytology - PAP( [place]) - Urine cytology ancillary only(Rachel) - Hemoglobinopathy Evaluation - [place] MFM OB COMP + 14 WK; Future - Genetic screen- will talk with partner and let us know.   Initial labs drawn. Continue prenatal vitamins. Problem list reviewed and updated. Genetic Screening discussed, patient unsure, would like to discuss with FOB: undecided. Ultrasound discussed; fetal anatomic survey: ordered. Discussed usage of Babyscripts and virtual visits as additional source of managing and completing prenatal visits in midst of coronavirus and pandemic.   Anticipatory guidance for prenatal visits including labs, ultrasounds, and testing; Initial labs drawn. Encouraged to complete MyChart Registration for her ability to review results, send requests, and have questions addressed.  The nature of [place] for [facility] with multiple MDs and Advanced Practice Providers was explained to patient; also emphasized that residents, students are part of our team. Routine obstetric precautions reviewed. Encouraged to seek out care at office or emergency room [facility] preferred) for urgent and/or emergent concerns. No follow-ups on file.     Eliana Lueth, Artist Pais, [place] for [employer], [place]

## 2020-02-16 NOTE — Patient Instructions (Signed)
[email or web address] 

## 2020-02-16 NOTE — Progress Notes (Signed)
Bedside U/S shows single IUP with FHT of 165 BPM  CRL measures 57.53mm  GA is [redacted]w[redacted]d

## 2020-02-19 LAB — URINE CYTOLOGY ANCILLARY ONLY
Chlamydia: NEGATIVE
Comment: NEGATIVE
Comment: NORMAL
Neisseria Gonorrhea: NEGATIVE

## 2020-02-19 LAB — URINE CULTURE, OB REFLEX

## 2020-02-19 LAB — CULTURE, OB URINE

## 2020-02-20 LAB — HEMOGLOBINOPATHY EVALUATION
Fetal Hemoglobin Testing: <1 % (ref 0.0–1.9)
HCT: 37.1 % (ref 35.0–45.0)
Hemoglobin A2 - HGBRFX: 2.5 % (ref 2.2–3.2)
Hemoglobin: 12.1 g/dL (ref 11.7–15.5)
Hgb A: 97.5 % (ref >96.0)
MCH: 30.8 pg (ref 27.0–33.0)
MCV: 94.4 fL (ref 80.0–100.0)
RBC: 3.93 10*6/uL (ref 3.80–5.10)
RDW: 11.8 % (ref 11.0–15.0)

## 2020-02-20 LAB — OBSTETRIC PANEL
Absolute Monocytes: 531 cells/uL (ref 200–950)
Antibody Screen: NOT DETECTED
Basophils Absolute: 50 cells/uL (ref 0–200)
Basophils Relative: 0.6 %
Eosinophils Absolute: 91 cells/uL (ref 15–500)
Eosinophils Relative: 1.1 %
HCT: 36.1 % (ref 35.0–45.0)
Hemoglobin: 12.1 g/dL (ref 11.7–15.5)
Hepatitis B Surface Ag: NONREACTIVE
Lymphs Abs: 1585 cells/uL (ref 850–3900)
MCH: 30.7 pg (ref 27.0–33.0)
MCHC: 33.5 g/dL (ref 32.0–36.0)
MCV: 91.6 fL (ref 80.0–100.0)
MPV: 9.7 fL (ref 7.5–12.5)
Monocytes Relative: 6.4 %
Neutro Abs: 6042 cells/uL (ref 1500–7800)
Neutrophils Relative %: 72.8 %
Platelets: 255 10*3/uL (ref 140–400)
RBC: 3.94 10*6/uL (ref 3.80–5.10)
RDW: 11.9 % (ref 11.0–15.0)
RPR Ser Ql: NONREACTIVE
Rubella: 7.69 Index
Total Lymphocyte: 19.1 %
WBC: 8.3 10*3/uL (ref 3.8–10.8)

## 2020-02-20 LAB — HEPATITIS C ANTIBODY
Hepatitis C Ab: NONREACTIVE
SIGNAL TO CUT-OFF: 0.01 (ref <1.00)

## 2020-02-20 LAB — HIV ANTIBODY (ROUTINE TESTING W REFLEX): HIV 1&2 Ab, 4th Generation: NONREACTIVE

## 2020-03-12 ENCOUNTER — Ambulatory Visit (INDEPENDENT_AMBULATORY_CARE_PROVIDER_SITE_OTHER): Payer: Self-pay

## 2020-03-12 ENCOUNTER — Other Ambulatory Visit: Payer: Self-pay

## 2020-03-12 DIAGNOSIS — Z3400 Encounter for supervision of normal first pregnancy, unspecified trimester: Secondary | ICD-10-CM

## 2020-03-12 DIAGNOSIS — Z3A15 15 weeks gestation of pregnancy: Secondary | ICD-10-CM

## 2020-03-12 DIAGNOSIS — Z3402 Encounter for supervision of normal first pregnancy, second trimester: Secondary | ICD-10-CM

## 2020-03-12 NOTE — Patient Instructions (Signed)

## 2020-03-12 NOTE — Progress Notes (Signed)
   PRENATAL VISIT NOTE  Subjective:  Ariel Stephens is a 29 y.o. G1P0 at [redacted]w[redacted]d being seen today for ongoing prenatal care.  She is currently monitored for the following issues for this low-risk pregnancy and has History of severe cervical dysplasia and Supervision of normal first pregnancy on their problem list.  Patient reports no complaints.   . Vag. Bleeding: None.  Movement: Absent. Denies leaking of fluid.   The following portions of the patient's history were reviewed and updated as appropriate: allergies, current medications, past family history, past medical history, past social history, past surgical history and problem list.   Objective:   Vitals:   03/12/20 0931  BP: 102/63  Pulse: 79  Weight: 146 lb (66.2 kg)    Fetal Status: Fetal Heart Rate (bpm): 154   Movement: Absent     General:  Alert, oriented and cooperative. Patient is in no acute distress.  Skin: Skin is warm and dry. No rash noted.   Cardiovascular: Normal heart rate noted  Respiratory: Normal respiratory effort, no problems with respiration noted  Abdomen: Soft, gravid, appropriate for gestational age.  Pain/Pressure: Absent     Pelvic: Cervical exam deferred        Extremities: Normal range of motion.  Edema: None  Mental Status: Normal mood and affect. Normal behavior. Normal judgment and thought content.   Assessment and Plan:  Pregnancy: G1P0 at [redacted]w[redacted]d 1. Supervision of normal first pregnancy, antepartum -No complaints. Routine care -Declines genetic screening. Reviewed NOB labs -Anatomy u/s scheduled 7/23 -Discussed prenatal classes and encouraged patient to sign up -Reviewed expectation for next visits  Term labor symptoms and general obstetric precautions including but not limited to vaginal bleeding, contractions, leaking of fluid and fetal movement were reviewed in detail with the patient. Please refer to After Visit Summary for other counseling recommendations.   Return in about 4 weeks  (around 04/09/2020) for Return OB visit.  Future Appointments  Date Time Provider Department Center  04/05/2020  9:45 AM [facility]    Rolm Bookbinder, CNM

## 2020-04-05 ENCOUNTER — Other Ambulatory Visit: Payer: Self-pay

## 2020-04-05 ENCOUNTER — Other Ambulatory Visit: Payer: Self-pay | Admitting: Obstetrics and Gynecology

## 2020-04-05 ENCOUNTER — Ambulatory Visit: Payer: Medicaid Other | Attending: Obstetrics and Gynecology

## 2020-04-05 DIAGNOSIS — Z3400 Encounter for supervision of normal first pregnancy, unspecified trimester: Secondary | ICD-10-CM

## 2020-04-05 DIAGNOSIS — O3442 Maternal care for other abnormalities of cervix, second trimester: Secondary | ICD-10-CM | POA: Diagnosis not present

## 2020-04-05 DIAGNOSIS — O358XX0 Maternal care for other (suspected) fetal abnormality and damage, not applicable or unspecified: Secondary | ICD-10-CM

## 2020-04-05 DIAGNOSIS — Z3A19 19 weeks gestation of pregnancy: Secondary | ICD-10-CM | POA: Diagnosis not present

## 2020-04-05 DIAGNOSIS — Z363 Encounter for antenatal screening for malformations: Secondary | ICD-10-CM

## 2020-04-09 ENCOUNTER — Ambulatory Visit (INDEPENDENT_AMBULATORY_CARE_PROVIDER_SITE_OTHER): Payer: Medicaid Other | Admitting: Advanced Practice Midwife

## 2020-04-09 ENCOUNTER — Other Ambulatory Visit: Payer: Self-pay

## 2020-04-09 VITALS — BP 101/57 | HR 82 | Wt 151.0 lb

## 2020-04-09 DIAGNOSIS — Z3402 Encounter for supervision of normal first pregnancy, second trimester: Secondary | ICD-10-CM

## 2020-04-09 DIAGNOSIS — O26899 Other specified pregnancy related conditions, unspecified trimester: Secondary | ICD-10-CM

## 2020-04-09 DIAGNOSIS — Z3A19 19 weeks gestation of pregnancy: Secondary | ICD-10-CM

## 2020-04-09 DIAGNOSIS — Z3400 Encounter for supervision of normal first pregnancy, unspecified trimester: Secondary | ICD-10-CM

## 2020-04-09 DIAGNOSIS — R102 Pelvic and perineal pain: Secondary | ICD-10-CM

## 2020-04-09 NOTE — Progress Notes (Signed)
   PRENATAL VISIT NOTE  Subjective:  Ariel Stephens is a 29 y.o. G1P0 at [redacted]w[redacted]d being seen today for ongoing prenatal care.  She is currently monitored for the following issues for this low-risk pregnancy and has History of severe cervical dysplasia and Supervision of normal first pregnancy on their problem list.  Patient reports bilateral pain in lower abdomen/groin when standing at work, improves at rest.   . Vag. Bleeding: None.  Movement: Present. Denies leaking of fluid.   The following portions of the patient's history were reviewed and updated as appropriate: allergies, current medications, past family history, past medical history, past social history, past surgical history and problem list.   Objective:   Vitals:   04/09/20 1438  BP: (!) 101/57  Pulse: 82  Weight: 151 lb (68.5 kg)    Fetal Status: Fetal Heart Rate (bpm): 145   Movement: Present     General:  Alert, oriented and cooperative. Patient is in no acute distress.  Skin: Skin is warm and dry. No rash noted.   Cardiovascular: Normal heart rate noted  Respiratory: Normal respiratory effort, no problems with respiration noted  Abdomen: Soft, gravid, appropriate for gestational age.  Pain/Pressure: Absent     Pelvic: Cervical exam deferred        Extremities: Normal range of motion.  Edema: None  Mental Status: Normal mood and affect. Normal behavior. Normal judgment and thought content.   Assessment and Plan:  Pregnancy: G1P0 at [redacted]w[redacted]d 1. Supervision of normal first pregnancy, antepartum --Anticipatory guidance about next visits/weeks of pregnancy given. --Next visit in 4 weeks in office per pt preference  2. Pain of round ligament affecting pregnancy, antepartum --Rest/ice/heat/warm bath/Tylenol/pregnancy support belt --Pt has support belt, encouraged to wear at work and when standing/walking.  Preterm labor symptoms and general obstetric precautions including but not limited to vaginal bleeding,  contractions, leaking of fluid and fetal movement were reviewed in detail with the patient. Please refer to After Visit Summary for other counseling recommendations.   No follow-ups on file.  Future Appointments  Date Time Provider Department Center  05/10/2020  9:10 AM Donette Larry, CNM [facility]    Sharen Counter, CNM

## 2020-05-10 ENCOUNTER — Other Ambulatory Visit: Payer: Self-pay

## 2020-05-10 ENCOUNTER — Ambulatory Visit (INDEPENDENT_AMBULATORY_CARE_PROVIDER_SITE_OTHER): Payer: Medicaid Other | Admitting: Certified Nurse Midwife

## 2020-05-10 VITALS — BP 110/69 | HR 76 | Wt 155.0 lb

## 2020-05-10 DIAGNOSIS — Z3402 Encounter for supervision of normal first pregnancy, second trimester: Secondary | ICD-10-CM

## 2020-05-10 DIAGNOSIS — Z7189 Other specified counseling: Secondary | ICD-10-CM | POA: Diagnosis not present

## 2020-05-10 DIAGNOSIS — Z3A24 24 weeks gestation of pregnancy: Secondary | ICD-10-CM

## 2020-05-10 NOTE — Progress Notes (Signed)
Subjective:  Ariel Stephens is a 29 y.o. G1P0 at [redacted]w[redacted]d being seen today for ongoing prenatal care.  She is currently monitored for the following issues for this low-risk pregnancy and has History of severe cervical dysplasia and Supervision of normal first pregnancy on their problem list.  Patient reports some vaginal swelling after on feet for several hours, gets better once she sits/lies down. Also noticed some spider veins   . Vag. Bleeding: None.  Movement: Present. Denies leaking of fluid.   The following portions of the patient's history were reviewed and updated as appropriate: allergies, current medications, past family history, past medical history, past social history, past surgical history and problem list. Problem list updated.  Objective:   Vitals:   05/10/20 0907  BP: 110/69  Pulse: 76  Weight: 155 lb (70.3 kg)    Fetal Status: Fetal Heart Rate (bpm): 142 Fundal Height: 24 cm Movement: Present     General:  Alert, oriented and cooperative. Patient is in no acute distress.  Skin: Skin is warm and dry. No rash noted.   Cardiovascular: Normal heart rate noted  Respiratory: Normal respiratory effort, no problems with respiration noted  Abdomen: Soft, gravid, appropriate for gestational age. Pain/Pressure: Absent     Pelvic: Vag. Bleeding: None Vag D/C Character: Thin   Cervical exam deferred        Extremities: Normal range of motion.  Edema: None  Mental Status: Normal mood and affect. Normal behavior. Normal judgment and thought content.   Urinalysis:      Assessment and Plan:  Pregnancy: G1P0 at [redacted]w[redacted]d  1. [redacted] weeks gestation of pregnancy  2. Encounter for supervision of normal first pregnancy in second trimester - recommend maternity belt and compressions for swelling and spider veins  The patient was counseled on the potential benefits and lack of known risks of COVID vaccination, during pregnancy and breastfeeding, on today's visit. The patient's questions and  concerns were addressed today. The patient is still unsure of her decision for vaccination.   Preterm labor symptoms and general obstetric precautions including but not limited to vaginal bleeding, contractions, leaking of fluid and fetal movement were reviewed in detail with the patient. Please refer to After Visit Summary for other counseling recommendations.  Return in about 4 weeks (around 06/07/2020).   Donette Larry, CNM

## 2020-06-07 ENCOUNTER — Ambulatory Visit (INDEPENDENT_AMBULATORY_CARE_PROVIDER_SITE_OTHER): Payer: Medicaid Other | Admitting: Obstetrics and Gynecology

## 2020-06-07 ENCOUNTER — Other Ambulatory Visit: Payer: Self-pay

## 2020-06-07 ENCOUNTER — Other Ambulatory Visit: Payer: Self-pay | Admitting: Obstetrics and Gynecology

## 2020-06-07 VITALS — BP 113/69 | HR 79 | Wt 158.0 lb

## 2020-06-07 DIAGNOSIS — Z3402 Encounter for supervision of normal first pregnancy, second trimester: Secondary | ICD-10-CM

## 2020-06-07 DIAGNOSIS — Z23 Encounter for immunization: Secondary | ICD-10-CM | POA: Diagnosis not present

## 2020-06-07 DIAGNOSIS — Z7189 Other specified counseling: Secondary | ICD-10-CM | POA: Diagnosis not present

## 2020-06-07 DIAGNOSIS — Z3A28 28 weeks gestation of pregnancy: Secondary | ICD-10-CM

## 2020-06-07 DIAGNOSIS — Z3A24 24 weeks gestation of pregnancy: Secondary | ICD-10-CM

## 2020-06-07 NOTE — Progress Notes (Signed)
   PRENATAL VISIT NOTE  Subjective:  Ariel Stephens is a 29 y.o. G1P0 at [redacted]w[redacted]d being seen today for ongoing prenatal care.  She is currently monitored for the following issues for this low-risk pregnancy and has History of severe cervical dysplasia and Supervision of normal first pregnancy on their problem list.  Patient reports no complaints.  Contractions: Not present. Vag. Bleeding: None.  Movement: Present. Denies leaking of fluid.   The following portions of the patient's history were reviewed and updated as appropriate: allergies, current medications, past family history, past medical history, past social history, past surgical history and problem list.   Objective:   Vitals:   06/07/20 0809  BP: 113/69  Pulse: 79  Weight: 158 lb (71.7 kg)    Fetal Status: Fetal Heart Rate (bpm): 148 Fundal Height: 27 cm Movement: Present     General:  Alert, oriented and cooperative. Patient is in no acute distress.  Skin: Skin is warm and dry. No rash noted.   Cardiovascular: Normal heart rate noted  Respiratory: Normal respiratory effort, no problems with respiration noted  Abdomen: Soft, gravid, appropriate for gestational age.  Pain/Pressure: Absent     Pelvic: Cervical exam deferred        Extremities: Normal range of motion.  Edema: None  Mental Status: Normal mood and affect. Normal behavior. Normal judgment and thought content.   Assessment and Plan:  Pregnancy: G1P0 at [redacted]w[redacted]d 1. [redacted] weeks gestation of pregnancy  - 2Hr GTT w/ 1 Hr Carpenter 75 g - HIV antibody (with reflex) - CBC - [facility]  - COVID-19 Vaccine Counseling: The patient was counseled on the potential benefits and lack of known risks of COVID vaccination, during pregnancy and breastfeeding, during today's visit. The patient's questions and concerns were addressed today, including safety of the vaccination and potential side effects as they have been published by ACOG and [facility]. The patient has been informed that there  have not been any documented vaccine related injuries, deaths or birth defects to infant or mom after receiving the COVID-19 vaccine to date. The patient has been made aware that although she is not at increased risk of contracting COVID-19 during pregnancy, she is at increased risk of developing severe disease and complications if she contracts COVID-19 while pregnant. All patient questions were addressed during our visit today. The patient is still unsure of her decision for vaccination.      Preterm labor symptoms and general obstetric precautions including but not limited to vaginal bleeding, contractions, leaking of fluid and fetal movement were reviewed in detail with the patient. Please refer to After Visit Summary for other counseling recommendations.   Return in about 2 weeks (around 06/21/2020) for for low risk OB.  Future Appointments  Date Time Provider Department Center  06/19/2020  8:30 AM Nugent, Odie Sera, NP [facility] [place]    Venia Carbon, NP

## 2020-06-08 LAB — 2HR GTT W 1 HR, CARPENTER, 75 G
Glucose, 1 Hr, Gest: 119 mg/dL (ref 65–179)
Glucose, 2 Hr, Gest: 103 mg/dL (ref 65–152)
Glucose, Fasting, Gest: 76 mg/dL (ref 65–91)

## 2020-06-10 LAB — CBC
HCT: 32.4 % — ABNORMAL LOW (ref 35.0–45.0)
Hemoglobin: 10.8 g/dL — ABNORMAL LOW (ref 11.7–15.5)
MCH: 30.3 pg (ref 27.0–33.0)
MCHC: 33.3 g/dL (ref 32.0–36.0)
MCV: 90.8 fL (ref 80.0–100.0)
MPV: 9.9 fL (ref 7.5–12.5)
Platelets: 252 10*3/uL (ref 140–400)
RBC: 3.57 10*6/uL — ABNORMAL LOW (ref 3.80–5.10)
RDW: 12 % (ref 11.0–15.0)
WBC: 8.9 10*3/uL (ref 3.8–10.8)

## 2020-06-10 LAB — HIV ANTIBODY (ROUTINE TESTING W REFLEX): HIV 1&2 Ab, 4th Generation: NONREACTIVE

## 2020-06-10 LAB — RPR: RPR Ser Ql: NONREACTIVE

## 2020-06-19 ENCOUNTER — Other Ambulatory Visit: Payer: Self-pay

## 2020-06-19 ENCOUNTER — Ambulatory Visit (INDEPENDENT_AMBULATORY_CARE_PROVIDER_SITE_OTHER): Payer: Medicaid Other | Admitting: Women's Health

## 2020-06-19 VITALS — BP 112/62 | HR 79 | Wt 160.0 lb

## 2020-06-19 DIAGNOSIS — Z3A30 30 weeks gestation of pregnancy: Secondary | ICD-10-CM

## 2020-06-19 DIAGNOSIS — Z87410 Personal history of cervical dysplasia: Secondary | ICD-10-CM

## 2020-06-19 DIAGNOSIS — Z3403 Encounter for supervision of normal first pregnancy, third trimester: Secondary | ICD-10-CM

## 2020-06-19 NOTE — Patient Instructions (Addendum)
Maternity Assessment Unit (MAU)  The Maternity Assessment Unit (MAU) is located at the [facility] and [facility] at [facility]. The address is: [address] [place], [place], [place] [address]. Please see map below for additional directions.    The Maternity Assessment Unit is designed to help you during your pregnancy, and for up to 6 weeks after delivery, with any pregnancy- or postpartum-related emergencies, if you think you are in labor, or if your water has broken. For example, if you experience nausea and vomiting, vaginal bleeding, severe abdominal or pelvic pain, elevated blood pressure or other problems related to your pregnancy or postpartum time, please come to the Maternity Assessment Unit for assistance.        Contraception Choices - www.bedsider.org Contraception, also called birth control, refers to methods or devices that prevent pregnancy. Hormonal methods Contraceptive implant  A contraceptive implant is a thin, plastic tube that contains a hormone. It is inserted into the upper part of the arm. It can remain in place for up to 3 years. Progestin-only injections Progestin-only injections are injections of progestin, a synthetic form of the hormone progesterone. They are given every 3 months by a health care provider. Birth control pills  Birth control pills are pills that contain hormones that prevent pregnancy. They must be taken once a day, preferably at the same time each day. Birth control patch  The birth control patch contains hormones that prevent pregnancy. It is placed on the skin and must be changed once a week for three weeks and removed on the fourth week. A prescription is needed to use this method of contraception. Vaginal ring  A vaginal ring contains hormones that prevent pregnancy. It is placed in the vagina for three weeks and removed on the fourth week. After that, the process is repeated with a new ring. A prescription is  needed to use this method of contraception. Emergency contraceptive Emergency contraceptives prevent pregnancy after unprotected sex. They come in pill form and can be taken up to 5 days after sex. They work best the sooner they are taken after having sex. Most emergency contraceptives are available without a prescription. This method should not be used as your only form of birth control. Barrier methods Female condom  A female condom is a thin sheath that is worn over the penis during sex. Condoms keep sperm from going inside a woman's body. They can be used with a spermicide to increase their effectiveness. They should be disposed after a single use. Female condom  A female condom is a soft, loose-fitting sheath that is put into the vagina before sex. The condom keeps sperm from going inside a woman's body. They should be disposed after a single use. Diaphragm  A diaphragm is a soft, dome-shaped barrier. It is inserted into the vagina before sex, along with a spermicide. The diaphragm blocks sperm from entering the uterus, and the spermicide kills sperm. A diaphragm should be left in the vagina for 6-8 hours after sex and removed within 24 hours. A diaphragm is prescribed and fitted by a health care provider. A diaphragm should be replaced every 1-2 years, after giving birth, after gaining more than 15 lb (6.8 kg), and after pelvic surgery. Cervical cap  A cervical cap is a round, soft latex or plastic cup that fits over the cervix. It is inserted into the vagina before sex, along with spermicide. It blocks sperm from entering the uterus. The cap should be left in place for  6-8 hours after sex and removed within 48 hours. A cervical cap must be prescribed and fitted by a health care provider. It should be replaced every 2 years. Sponge  A sponge is a soft, circular piece of polyurethane foam with spermicide on it. The sponge helps block sperm from entering the uterus, and the spermicide kills sperm.  To use it, you make it wet and then insert it into the vagina. It should be inserted before sex, left in for at least 6 hours after sex, and removed and thrown away within 30 hours. Spermicides Spermicides are chemicals that kill or block sperm from entering the cervix and uterus. They can come as a cream, jelly, suppository, foam, or tablet. A spermicide should be inserted into the vagina with an applicator at least 10-15 minutes before sex to allow time for it to work. The process must be repeated every time you have sex. Spermicides do not require a prescription. Intrauterine contraception Intrauterine device (IUD) An IUD is a T-shaped device that is put in a woman's uterus. There are two types:  Hormone IUD.This type contains progestin, a synthetic form of the hormone progesterone. This type can stay in place for 3-5 years.  Copper IUD.This type is wrapped in copper wire. It can stay in place for 10 years.  Permanent methods of contraception Female tubal ligation In this method, a woman's fallopian tubes are sealed, tied, or blocked during surgery to prevent eggs from traveling to the uterus. Hysteroscopic sterilization In this method, a small, flexible insert is placed into each fallopian tube. The inserts cause scar tissue to form in the fallopian tubes and block them, so sperm cannot reach an egg. The procedure takes about 3 months to be effective. Another form of birth control must be used during those 3 months. Female sterilization This is a procedure to tie off the tubes that carry sperm (vasectomy). After the procedure, the man can still ejaculate fluid (semen). Natural planning methods Natural family planning In this method, a couple does not have sex on days when the woman could become pregnant. Calendar method This means keeping track of the length of each menstrual cycle, identifying the days when pregnancy can happen, and not having sex on those days. Ovulation method In this  method, a couple avoids sex during ovulation. Symptothermal method This method involves not having sex during ovulation. The woman typically checks for ovulation by watching changes in her temperature and in the consistency of cervical mucus. Post-ovulation method In this method, a couple waits to have sex until after ovulation. Summary  Contraception, also called birth control, means methods or devices that prevent pregnancy.  Hormonal methods of contraception include implants, injections, pills, patches, vaginal rings, and emergency contraceptives.  Barrier methods of contraception can include female condoms, female condoms, diaphragms, cervical caps, sponges, and spermicides.  There are two types of IUDs (intrauterine devices). An IUD can be put in a woman's uterus to prevent pregnancy for 3-5 years.  Permanent sterilization can be done through a procedure for males, females, or both.  Natural family planning methods involve not having sex on days when the woman could become pregnant. This information is not intended to replace advice given to you by your health care provider. Make sure you discuss any questions you have with your health care provider. Document Revised: 09/02/2017 Document Reviewed: 10/03/2016 Elsevier Patient Education  2020 Elsevier Inc.    AREA PEDIATRIC/FAMILY PRACTICE PHYSICIANS  [facility] [address]  [address] [place], [place] [address] Phone - [phone]   Fax - [phone]  JACK AMOS [address] [place], [place]  [address] Phone - [phone]   Fax - [phone]  [facility] [address] [place], [place]  [address] Phone - [phone]   Fax - [phone]  [facility] [address] [place], [place]  [address] Phone - [phone]   Fax - [phone]  [facility] [address] [place], [place]  [address] Phone - [phone]   Fax - [phone]   [facility] [address], [address] [place], [place]  [address] Phone - [phone]   Fax - [phone]  [facility] [address], [address] [place], [place]  [address] Phone - [phone]   Fax - [phone]  [facility] [address] [place], [place]  [address] Phone - [phone]   Fax - [phone]  [facility] [address] [place], [place]  [address] Phone - [phone]   Fax - [phone] [facility] [place] [place] [address] [place], [place]  [address] Phone - [phone]   Fax - [phone]  [facility] [address] [place], [place]  [address] Phone - [phone]   Fax - [phone]  [facility] [address], [address] [place], [place]  [address] Phone - [phone]   Fax - [phone]  [facility] [address], [address] [place], [place]  [address] Phone - [phone]   Fax - [phone]  [facility] [address], [address] [place], [place]  [address] Phone - [phone]  [facility] [address] [place], [place]  [address] Phone - [phone]   Fax - [phone]  [facility] [address], [address] [place], [place]  [address] Phone - [phone]   Fax - [phone]  [facility] [address] [place], [place]  [address] Phone - [phone]   Fax - [phone]  [facility] [address] [place], [place]  [address] Phone - [phone]   Fax - [phone]   [facility] [address], [address] [place], [place]  [address] Phone - [phone]   Fax - [phone]  [facility] [address], [address] [place], [place]  [address] Phone - [phone]   Fax - [phone]  DAVID RUBIN [address], [address] [place], [place]  [address]  Phone - [phone]   Fax - [phone]  [facility] [address], [address] [place], [place]  [address] Phone - [phone]   Fax - [phone]  [facility] [address] [place], [place]  [address] Phone - [phone]   Fax - [phone] [facility] [place] [address] [place], [place]  [address] Phone - [phone]   Fax - [phone]  [facility] [address] [place], [place]  [address] Phone - [phone]   Fax - [phone]  [facility] [place] [address] [place], [place]  [address] Phone - [phone]   Fax - [phone]          Preterm Labor and Birth Information  The normal length of a pregnancy is 39-41 weeks. Preterm labor is when labor starts before 37 completed weeks of pregnancy. What are the risk factors for preterm labor? Preterm labor is more  likely to occur in women who:  Have certain infections during pregnancy such as a bladder infection, sexually transmitted infection, or infection inside the uterus (chorioamnionitis).  Have a shorter-than-normal cervix.  Have gone into preterm labor before.  Have had surgery on their cervix.  Are younger than age 41 or older than age 59.  Are African American.  Are pregnant with twins or multiple babies (multiple gestation).  Take street drugs or smoke while pregnant.  Do not gain enough weight while pregnant.  Became pregnant shortly after having been pregnant. What are the symptoms of preterm labor? Symptoms of preterm labor include:  Cramps similar to those that can happen during a menstrual period. The cramps may happen with diarrhea.  Pain in the abdomen or lower back.  Regular uterine contractions that may feel like tightening of the abdomen.  A feeling of increased pressure in the pelvis.  Increased watery or bloody mucus discharge from the vagina.  Water breaking (ruptured  amniotic sac). Why is it important to recognize signs of preterm labor? It is important to recognize signs of preterm labor because babies who are born prematurely may not be fully developed. This can put them at an increased risk for:  Long-term (chronic) heart and lung problems.  Difficulty immediately after birth with regulating body systems, including blood sugar, body temperature, heart rate, and breathing rate.  Bleeding in the brain.  Cerebral palsy.  Learning difficulties.  Death. These risks are highest for babies who are born before 34 weeks of pregnancy. How is preterm labor treated? Treatment depends on the length of your pregnancy, your condition, and the health of your baby. It may involve:  Having a stitch (suture) placed in your cervix to prevent your cervix from opening too early (cerclage).  Taking or being given medicines, such as: ? Hormone medicines. These may be given early in pregnancy to help support the pregnancy. ? Medicine to stop contractions. ? Medicines to help mature the baby's lungs. These may be prescribed if the risk of delivery is high. ? Medicines to prevent your baby from developing cerebral palsy. If the labor happens before 34 weeks of pregnancy, you may need to stay in the hospital. What should I do if I think I am in preterm labor? If you think that you are going into preterm labor, call your health care provider right away. How can I prevent preterm labor in future pregnancies? To increase your chance of having a full-term pregnancy:  Do not use any tobacco products, such as cigarettes, chewing tobacco, and e-cigarettes. If you need help quitting, ask your health care provider.  Do not use street drugs or medicines that have not been prescribed to you during your pregnancy.  Talk with your health care provider before taking any herbal supplements, even if you have been taking them regularly.  Make sure you gain a healthy amount of weight  during your pregnancy.  Watch for infection. If you think that you might have an infection, get it checked right away.  Make sure to tell your health care provider if you have gone into preterm labor before. This information is not intended to replace advice given to you by your health care provider. Make sure you discuss any questions you have with your health care provider. Document Revised: 12/23/2018 Document Reviewed: 01/22/2016 Elsevier Patient Education  2020 [employer].       Levonorgestrel intrauterine device (IUD) What is this medicine? LEVONORGESTREL IUD (LEE voe nor jes trel) is  a contraceptive (birth control) device. The device is placed inside the uterus by a healthcare professional. It is used to prevent pregnancy. This device can also be used to treat heavy bleeding that occurs during your period. This medicine may be used for other purposes; ask your health care provider or pharmacist if you have questions. COMMON BRAND NAME(S): Cameron Ali What should I tell my health care provider before I take this medicine? They need to know if you have any of these conditions:  abnormal Pap smear  cancer of the breast, uterus, or cervix  diabetes  endometritis  genital or pelvic infection now or in the past  have more than one sexual partner or your partner has more than one partner  heart disease  history of an ectopic or tubal pregnancy  immune system problems  IUD in place  liver disease or tumor  problems with blood clots or take blood-thinners  seizures  use intravenous drugs  uterus of unusual shape  vaginal bleeding that has not been explained  an unusual or allergic reaction to levonorgestrel, other hormones, silicone, or polyethylene, medicines, foods, dyes, or preservatives  pregnant or trying to get pregnant  breast-feeding How should I use this medicine? This device is placed inside the uterus by a health care  professional. Talk to your pediatrician regarding the use of this medicine in children. Special care may be needed. Overdosage: If you think you have taken too much of this medicine contact a poison control center or emergency room at once. NOTE: This medicine is only for you. Do not share this medicine with others. What if I miss a dose? This does not apply. Depending on the brand of device you have inserted, the device will need to be replaced every 3 to 6 years if you wish to continue using this type of birth control. What may interact with this medicine? Do not take this medicine with any of the following medications:  amprenavir  bosentan  fosamprenavir This medicine may also interact with the following medications:  aprepitant  armodafinil  barbiturate medicines for inducing sleep or treating seizures  bexarotene  boceprevir  griseofulvin  medicines to treat seizures like carbamazepine, ethotoin, felbamate, oxcarbazepine, phenytoin, topiramate  modafinil  pioglitazone  rifabutin  rifampin  rifapentine  some medicines to treat HIV infection like atazanavir, efavirenz, indinavir, lopinavir, nelfinavir, tipranavir, ritonavir  St. John's wort  warfarin This list may not describe all possible interactions. Give your health care provider a list of all the medicines, herbs, non-prescription drugs, or dietary supplements you use. Also tell them if you smoke, drink alcohol, or use illegal drugs. Some items may interact with your medicine. What should I watch for while using this medicine? Visit your doctor or health care professional for regular check ups. See your doctor if you or your partner has sexual contact with others, becomes HIV positive, or gets a sexual transmitted disease. This product does not protect you against HIV infection (AIDS) or other sexually transmitted diseases. You can check the placement of the IUD yourself by reaching up to the top of your  vagina with clean fingers to feel the threads. Do not pull on the threads. It is a good habit to check placement after each menstrual period. Call your doctor right away if you feel more of the IUD than just the threads or if you cannot feel the threads at all. The IUD may come out by itself. You may become pregnant if the device  comes out. If you notice that the IUD has come out use a backup birth control method like condoms and call your health care provider. Using tampons will not change the position of the IUD and are okay to use during your period. This IUD can be safely scanned with magnetic resonance imaging (MRI) only under specific conditions. Before you have an MRI, tell your healthcare provider that you have an IUD in place, and which type of IUD you have in place. What side effects may I notice from receiving this medicine? Side effects that you should report to your doctor or health care professional as soon as possible:  allergic reactions like skin rash, itching or hives, swelling of the face, lips, or tongue  fever, flu-like symptoms  genital sores  high blood pressure  no menstrual period for 6 weeks during use  pain, swelling, warmth in the leg  pelvic pain or tenderness  severe or sudden headache  signs of pregnancy  stomach cramping  sudden shortness of breath  trouble with balance, talking, or walking  unusual vaginal bleeding, discharge  yellowing of the eyes or skin Side effects that usually do not require medical attention (report to your doctor or health care professional if they continue or are bothersome):  acne  breast pain  change in sex drive or performance  changes in weight  cramping, dizziness, or faintness while the device is being inserted  headache  irregular menstrual bleeding within first 3 to 6 months of use  nausea This list may not describe all possible side effects. Call your doctor for medical advice about side effects. You  may report side effects to FDA at 1-800-FDA-1088. Where should I keep my medicine? This does not apply. NOTE: This sheet is a summary. It may not cover all possible information. If you have questions about this medicine, talk to your doctor, pharmacist, or health care provider.  2020 Elsevier/Gold Standard (2018-07-12 13:22:01)

## 2020-06-19 NOTE — Progress Notes (Signed)
Subjective:  Ariel Stephens is a 29 y.o. G1P0 at [redacted]w[redacted]d being seen today for ongoing prenatal care.  She is currently monitored for the following issues for this low-risk pregnancy and has History of severe cervical dysplasia and Supervision of normal first pregnancy on their problem list.  Patient reports no complaints.  Contractions: Not present. Vag. Bleeding: None.  Movement: Present. Denies leaking of fluid.   The following portions of the patient's history were reviewed and updated as appropriate: allergies, current medications, past family history, past medical history, past social history, past surgical history and problem list. Problem list updated.  Objective:   Vitals:   06/19/20 0829  BP: 112/62  Pulse: 79  Weight: 160 lb (72.6 kg)    Fetal Status: Fetal Heart Rate (bpm): 147   Movement: Present     General:  Alert, oriented and cooperative. Patient is in no acute distress.  Skin: Skin is warm and dry. No rash noted.   Cardiovascular: Normal heart rate noted  Respiratory: Normal respiratory effort, no problems with respiration noted  Abdomen: Soft, gravid, appropriate for gestational age. Pain/Pressure: Absent     Pelvic: Vag. Bleeding: None Vag D/C Character: Thin   Cervical exam deferred        Extremities: Normal range of motion.  Edema: None  Mental Status: Normal mood and affect. Normal behavior. Normal judgment and thought content.   Urinalysis:      Assessment and Plan:  Pregnancy: G1P0 at [redacted]w[redacted]d  1. [redacted] weeks gestation of pregnancy  2. Encounter for supervision of normal first pregnancy in third trimester -discussed contraception, pt elects in-patient Mirena IUD -peds list  given  3. 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 Normal paps since then. Last Pap 2019 - normal  Preterm labor symptoms and general obstetric precautions  including but not limited to vaginal bleeding, contractions, leaking of fluid and fetal movement were reviewed in detail with the patient. I discussed the assessment and treatment plan with the patient. The patient was provided an opportunity to ask questions and all were answered. The patient agreed with the plan and demonstrated an understanding of the instructions. The patient was advised to call back or seek an in-person office evaluation/go to MAU at [facility] for any urgent or concerning symptoms. Please refer to After Visit Summary for other counseling recommendations.  Return in about 2 weeks (around 07/03/2020) for in-pereson LOB/APP OK.   Jenina Moening, Odie Sera, NP

## 2020-07-03 ENCOUNTER — Ambulatory Visit (INDEPENDENT_AMBULATORY_CARE_PROVIDER_SITE_OTHER): Payer: Medicaid Other | Admitting: Obstetrics and Gynecology

## 2020-07-03 ENCOUNTER — Other Ambulatory Visit: Payer: Self-pay

## 2020-07-03 VITALS — BP 107/64 | HR 86 | Wt 162.0 lb

## 2020-07-03 DIAGNOSIS — Z3403 Encounter for supervision of normal first pregnancy, third trimester: Secondary | ICD-10-CM

## 2020-07-03 DIAGNOSIS — Z3A32 32 weeks gestation of pregnancy: Secondary | ICD-10-CM

## 2020-07-03 NOTE — Progress Notes (Signed)
   PRENATAL VISIT NOTE  Subjective:  Ariel Stephens is a 29 y.o. G1P0 at [redacted]w[redacted]d being seen today for ongoing prenatal care.  She is currently monitored for the following issues for this low-risk pregnancy and has History of severe cervical dysplasia and Supervision of normal first pregnancy on their problem list.  Patient reports no complaints.  Contractions: Not present. Vag. Bleeding: None.  Movement: Present. Denies leaking of fluid.   The following portions of the patient's history were reviewed and updated as appropriate: allergies, current medications, past family history, past medical history, past social history, past surgical history and problem list.   Objective:   Vitals:   07/03/20 0835  BP: 107/64  Pulse: 86  Weight: 162 lb (73.5 kg)    Fetal Status: Fetal Heart Rate (bpm): 153 Fundal Height: 32 cm Movement: Present     General:  Alert, oriented and cooperative. Patient is in no acute distress.  Skin: Skin is warm and dry. No rash noted.   Cardiovascular: Normal heart rate noted  Respiratory: Normal respiratory effort, no problems with respiration noted  Abdomen: Soft, gravid, appropriate for gestational age.  Pain/Pressure: Absent     Pelvic: Cervical exam deferred        Extremities: Normal range of motion.  Edema: None  Mental Status: Normal mood and affect. Normal behavior. Normal judgment and thought content.   Assessment and Plan:  Pregnancy: G1P0 at [redacted]w[redacted]d   1. [redacted] weeks gestation of pregnancy   2. Encounter for supervision of normal first pregnancy in third trimester  Doing well [facility]    Preterm labor symptoms and general obstetric precautions including but not limited to vaginal bleeding, contractions, leaking of fluid and fetal movement were reviewed in detail with the patient. Please refer to After Visit Summary for other counseling recommendations.   No follow-ups on file.  No future appointments.   Venia Carbon, NP

## 2020-07-16 ENCOUNTER — Encounter: Payer: Self-pay | Admitting: Certified Nurse Midwife

## 2020-07-16 ENCOUNTER — Ambulatory Visit (INDEPENDENT_AMBULATORY_CARE_PROVIDER_SITE_OTHER): Payer: Medicaid Other | Admitting: Certified Nurse Midwife

## 2020-07-16 ENCOUNTER — Other Ambulatory Visit: Payer: Self-pay

## 2020-07-16 VITALS — BP 109/68 | HR 85 | Wt 166.0 lb

## 2020-07-16 DIAGNOSIS — Z3A33 33 weeks gestation of pregnancy: Secondary | ICD-10-CM

## 2020-07-16 DIAGNOSIS — Z3400 Encounter for supervision of normal first pregnancy, unspecified trimester: Secondary | ICD-10-CM

## 2020-07-16 NOTE — Progress Notes (Signed)
   PRENATAL VISIT NOTE  Subjective:  Ariel Stephens is a 29 y.o. G1P0 at [redacted]w[redacted]d being seen today for ongoing prenatal care.  She is currently monitored for the following issues for this low-risk pregnancy and has History of severe cervical dysplasia and Supervision of normal first pregnancy on their problem list.  Patient reports pain in her mid back that worsens with sitting for prolonged periods and improves with ambulation. She reports infrequent Braxton Hicks contractions that last for less than 10 seconds. She is feeling the baby move.  Contractions: Not present. Vag. Bleeding: None.  Movement: Present. Denies leaking of fluid.   The following portions of the patient's history were reviewed and updated as appropriate: allergies, current medications, past family history, past medical history, past social history, past surgical history and problem list.   Objective:   Vitals:   07/16/20 1049  BP: 109/68  Pulse: 85  Weight: 166 lb (75.3 kg)    Fetal Status: Fetal Heart Rate (bpm): 151   Movement: Present   Fundal height: 32 cm   General:  Alert, oriented and cooperative. Patient is in no acute distress.  Skin: Skin is warm and dry. No rash noted.   Cardiovascular: Normal heart rate noted  Respiratory: Normal respiratory effort, no problems with respiration noted  Abdomen: Soft, gravid, appropriate for gestational age.  Pain/Pressure: Absent     Pelvic: Cervical exam deferred        Extremities: Normal range of motion.  Edema: None  Mental Status: Normal mood and affect. Normal behavior. Normal judgment and thought content.   Assessment and Plan:  Pregnancy: G1P0 at [redacted]w[redacted]d 1. [redacted] weeks gestation of pregnancy -routine prenatal care -discussed with patient the change in frequency to weekly appointments starting at 36 weeks -discussed with patient pain management options with delivery -talked about GBS screening at next visit   2. Supervision of normal first pregnancy,  antepartum -discussed pain in back due to sciatic nerve irritation   Preterm labor symptoms and general obstetric precautions including but not limited to vaginal bleeding, contractions, leaking of fluid and fetal movement were reviewed in detail with the patient. Please refer to After Visit Summary for other counseling recommendations.   Return in about 15 days (around 07/31/2020) for ROB/GBS.  Future Appointments  Date Time Provider Department Center  07/31/2020  8:30 AM Rasch, Harolyn Rutherford, NP [facility]    Zadie Cleverly, Student-PA

## 2020-07-16 NOTE — Patient Instructions (Signed)

## 2020-07-31 ENCOUNTER — Other Ambulatory Visit: Payer: Self-pay

## 2020-07-31 ENCOUNTER — Ambulatory Visit (INDEPENDENT_AMBULATORY_CARE_PROVIDER_SITE_OTHER): Payer: Medicaid Other | Admitting: Obstetrics and Gynecology

## 2020-07-31 ENCOUNTER — Other Ambulatory Visit (HOSPITAL_COMMUNITY)
Admission: RE | Admit: 2020-07-31 | Discharge: 2020-07-31 | Disposition: A | Discharge status: Home / Self Care | Payer: Medicaid Other | Source: Ambulatory Visit | Attending: Obstetrics and Gynecology | Admitting: Obstetrics and Gynecology

## 2020-07-31 VITALS — BP 115/65 | HR 78 | Wt 167.0 lb

## 2020-07-31 DIAGNOSIS — Z3403 Encounter for supervision of normal first pregnancy, third trimester: Secondary | ICD-10-CM | POA: Diagnosis present

## 2020-07-31 DIAGNOSIS — Z3A36 36 weeks gestation of pregnancy: Secondary | ICD-10-CM

## 2020-07-31 LAB — OB RESULTS CONSOLE GBS: GBS: NEGATIVE

## 2020-07-31 NOTE — Patient Instructions (Signed)

## 2020-07-31 NOTE — Progress Notes (Signed)
   PRENATAL VISIT NOTE  Subjective:  Ariel Stephens is a 29 y.o. G1P0 at [redacted]w[redacted]d being seen today for ongoing prenatal care.  She is currently monitored for the following issues for this low-risk pregnancy and has History of severe cervical dysplasia and Supervision of normal first pregnancy on their problem list.  Patient reports no complaints.  Contractions: Not present. Vag. Bleeding: None.  Movement: Present. Denies leaking of fluid.   The following portions of the patient's history were reviewed and updated as appropriate: allergies, current medications, past family history, past medical history, past social history, past surgical history and problem list.   Objective:   Vitals:   07/31/20 0825  BP: 115/65  Pulse: 78  Weight: 167 lb (75.8 kg)    Fetal Status: Fetal Heart Rate (bpm): 149 Fundal Height: 36 cm Movement: Present  Presentation: Undeterminable  General:  Alert, oriented and cooperative. Patient is in no acute distress.  Skin: Skin is warm and dry. No rash noted.   Cardiovascular: Normal heart rate noted  Respiratory: Normal respiratory effort, no problems with respiration noted  Abdomen: Soft, gravid, appropriate for gestational age.  Pain/Pressure: Absent     Pelvic: Cervical exam performed in the presence of a chaperone Dilation: Closed Effacement (%): 50 Station: -3  Extremities: Normal range of motion.  Edema: None  Mental Status: Normal mood and affect. Normal behavior. Normal judgment and thought content.   Assessment and Plan:  Pregnancy: G1P0 at [redacted]w[redacted]d 1. [redacted] weeks gestation of pregnancy  - She is taking prenatal courses through [facility] - Discussed IP Mirena- questions answered  - Discussed cervical ripening.  - Culture, Grp B Strep w/Rflx Suscept - Cervicovaginal ancillary only( [place])  Preterm labor symptoms and general obstetric precautions including but not limited to vaginal bleeding, contractions, leaking of fluid and fetal  movement were reviewed in detail with the patient. Please refer to After Visit Summary for other counseling recommendations.   No follow-ups on file.  Future Appointments  Date Time Provider Department Center  08/06/2020  3:10 PM Leftwich-Kirby, Wilmer Floor, CNM [facility]    Venia Carbon, NP

## 2020-08-01 LAB — CERVICOVAGINAL ANCILLARY ONLY
Chlamydia: NEGATIVE
Comment: NEGATIVE
Comment: NORMAL
Neisseria Gonorrhea: NEGATIVE

## 2020-08-03 LAB — CULTURE, STREPTOCOCCUS GRP B W/SUSCEPT
MICRO NUMBER:: 11215327
SPECIMEN QUALITY:: ADEQUATE

## 2020-08-06 ENCOUNTER — Ambulatory Visit (INDEPENDENT_AMBULATORY_CARE_PROVIDER_SITE_OTHER): Payer: Medicaid Other | Admitting: Advanced Practice Midwife

## 2020-08-06 ENCOUNTER — Other Ambulatory Visit: Payer: Self-pay

## 2020-08-06 VITALS — BP 115/71 | HR 78 | Wt 167.0 lb

## 2020-08-06 DIAGNOSIS — Z3400 Encounter for supervision of normal first pregnancy, unspecified trimester: Secondary | ICD-10-CM

## 2020-08-06 DIAGNOSIS — Z3A36 36 weeks gestation of pregnancy: Secondary | ICD-10-CM

## 2020-08-06 NOTE — Progress Notes (Signed)
   PRENATAL VISIT NOTE  Subjective:  Ariel Stephens is a 29 y.o. G1P0 at [redacted]w[redacted]d being seen today for ongoing prenatal care.  She is currently monitored for the following issues for this low-risk pregnancy and has History of severe cervical dysplasia and Supervision of normal first pregnancy on their problem list.  Patient reports no complaints.  Contractions: Not present. Vag. Bleeding: None.  Movement: Present. Denies leaking of fluid.   The following portions of the patient's history were reviewed and updated as appropriate: allergies, current medications, past family history, past medical history, past social history, past surgical history and problem list.   Objective:   Vitals:   08/06/20 1533  BP: 115/71  Pulse: 78  Weight: 167 lb (75.8 kg)    Fetal Status: Fetal Heart Rate (bpm): 130   Movement: Present     General:  Alert, oriented and cooperative. Patient is in no acute distress.  Skin: Skin is warm and dry. No rash noted.   Cardiovascular: Normal heart rate noted  Respiratory: Normal respiratory effort, no problems with respiration noted  Abdomen: Soft, gravid, appropriate for gestational age.  Pain/Pressure: Absent     Pelvic: Cervical exam deferred        Extremities: Normal range of motion.  Edema: None  Mental Status: Normal mood and affect. Normal behavior. Normal judgment and thought content.   Assessment and Plan:  Pregnancy: G1P0 at [redacted]w[redacted]d 1. [redacted] weeks gestation of pregnancy   2. Supervision of normal first pregnancy, antepartum --Anticipatory guidance about next visits/weeks of pregnancy given. --Next visit in 1 week in office  Term labor symptoms and general obstetric precautions including but not limited to vaginal bleeding, contractions, leaking of fluid and fetal movement were reviewed in detail with the patient. Please refer to After Visit Summary for other counseling recommendations.   No follow-ups on file.  Future Appointments  Date Time  Provider Department Center  08/13/2020  1:50 PM Gerrit Heck, CNM [facility]    Sharen Counter, CNM

## 2020-08-13 ENCOUNTER — Ambulatory Visit (INDEPENDENT_AMBULATORY_CARE_PROVIDER_SITE_OTHER): Payer: Medicaid Other

## 2020-08-13 ENCOUNTER — Other Ambulatory Visit: Payer: Self-pay

## 2020-08-13 VITALS — BP 123/75 | HR 74 | Wt 168.0 lb

## 2020-08-13 DIAGNOSIS — Z3A37 37 weeks gestation of pregnancy: Secondary | ICD-10-CM

## 2020-08-13 DIAGNOSIS — Z3400 Encounter for supervision of normal first pregnancy, unspecified trimester: Secondary | ICD-10-CM

## 2020-08-13 NOTE — Progress Notes (Signed)
   LOW-RISK PREGNANCY OFFICE VISIT  Patient name: Ariel Stephens MRN [id number]  Date of birth: [birth year] Chief Complaint:   Routine Prenatal Visit  Subjective:   Ariel Stephens is a 29 y.o. G1P0 female at [redacted]w[redacted]d with an Estimated Date of Delivery: 08/28/20 being seen today for ongoing management of a low-risk pregnancy aeb has History of severe cervical dysplasia and Supervision of normal first pregnancy on their problem list.  Patient presents today without complaints.  Patient endorses fetal movement and some BH contractions.  Patient denies abdominal pain and vaginal concerns including abnormal discharge, leaking of fluid, and bleeding. Patient expresses desire for low intervention, un-medicated delivery.  Contractions: Not present. Vag. Bleeding: None.  Movement: Present.  Reviewed past medical,surgical, social, obstetrical and family history as well as problem list, medications and allergies.  Objective   Vitals:   08/13/20 1350  BP: 123/75  Pulse: 74  Weight: 168 lb (76.2 kg)  Body mass index is 27.96 kg/m.  Total Weight Gain:28 lb (12.7 kg)         Physical Examination:   General appearance: Well appearing, and in no distress  Mental status: Alert, oriented to person, place, and time  Skin: Warm & dry  Cardiovascular: Normal heart rate noted  Respiratory: Normal respiratory effort, no distress  Abdomen: Soft, gravid, nontender, AGA with Fundal height of Fundal Height: 37 cm  Pelvic: Cervical exam deferred           Extremities: Edema: Mild pitting, slight indentation  Fetal Status: Fetal Heart Rate (bpm): 140  Movement: Present   No results found for this or any previous visit (from the past 24 hour(s)).  Assessment & Plan:  Low-risk pregnancy of a 29 y.o., G1P0 at [redacted]w[redacted]d with an Estimated Date of Delivery: 08/28/20   1. Supervision of normal first pregnancy, antepartum -Extensive discussion regarding labor and delivery. -Encouraged given regarding labor  process.   -Support offered for desire to labor un-medicated.  -Reviewed options for pain management other than epidural. -Discussed services available for lactation support.  -Planning for IP IUD.   2. [redacted] weeks gestation of pregnancy -Doing well -Reviewed scheduling of IOL after 40+ weeks. -Discussed VE at next visit if desired. -Reviewed anticipated pregnancy complaints (I.e. pelvic pressure) during late 3rd trimester.  -RTO in one week.    Meds: No orders of the defined types were placed in this encounter.  Labs/procedures today:  Lab Orders  No laboratory test(s) ordered today     Reviewed: Term labor symptoms and general obstetric precautions including but not limited to vaginal bleeding, contractions, leaking of fluid and fetal movement were reviewed in detail with the patient.  All questions were answered.  Follow-up: Return in about 1 week (around 08/20/2020) for LROB.  No orders of the defined types were placed in this encounter.  Cherre Robins MSN, CNM 08/13/2020

## 2020-08-17 ENCOUNTER — Inpatient Hospital Stay (HOSPITAL_COMMUNITY): Payer: Medicaid Other | Admitting: Anesthesiology

## 2020-08-17 ENCOUNTER — Other Ambulatory Visit: Payer: Self-pay

## 2020-08-17 ENCOUNTER — Encounter (HOSPITAL_COMMUNITY): Payer: Self-pay | Admitting: Obstetrics and Gynecology

## 2020-08-17 ENCOUNTER — Inpatient Hospital Stay (HOSPITAL_COMMUNITY)
Admission: AD | Admit: 2020-08-17 | Discharge: 2020-08-20 | DRG: 807 | Disposition: A | Discharge status: Home / Self Care | Payer: Medicaid Other | Attending: Obstetrics and Gynecology | Admitting: Obstetrics and Gynecology

## 2020-08-17 DIAGNOSIS — Z9889 Other specified postprocedural states: Secondary | ICD-10-CM | POA: Diagnosis not present

## 2020-08-17 DIAGNOSIS — O26893 Other specified pregnancy related conditions, third trimester: Secondary | ICD-10-CM | POA: Diagnosis present

## 2020-08-17 DIAGNOSIS — O411230 Chorioamnionitis, third trimester, not applicable or unspecified: Secondary | ICD-10-CM | POA: Diagnosis present

## 2020-08-17 DIAGNOSIS — Z3400 Encounter for supervision of normal first pregnancy, unspecified trimester: Secondary | ICD-10-CM

## 2020-08-17 DIAGNOSIS — Z87891 Personal history of nicotine dependence: Secondary | ICD-10-CM | POA: Diagnosis not present

## 2020-08-17 DIAGNOSIS — Z20822 Contact with and (suspected) exposure to covid-19: Secondary | ICD-10-CM | POA: Diagnosis present

## 2020-08-17 DIAGNOSIS — Z3A38 38 weeks gestation of pregnancy: Secondary | ICD-10-CM

## 2020-08-17 DIAGNOSIS — O4290 Premature rupture of membranes, unspecified as to length of time between rupture and onset of labor, unspecified weeks of gestation: Secondary | ICD-10-CM | POA: Diagnosis present

## 2020-08-17 LAB — TYPE AND SCREEN
ABO/RH(D): O POS
Antibody Screen: NEGATIVE

## 2020-08-17 LAB — RESP PANEL BY RT-PCR (FLU A&B, COVID) ARPGX2
Influenza A by PCR: NEGATIVE
Influenza B by PCR: NEGATIVE
SARS Coronavirus 2 by RT PCR: NEGATIVE

## 2020-08-17 LAB — CBC
HCT: 36.7 % (ref 36.0–46.0)
Hemoglobin: 12.1 g/dL (ref 12.0–15.0)
MCH: 29.9 pg (ref 26.0–34.0)
MCHC: 33 g/dL (ref 30.0–36.0)
MCV: 90.6 fL (ref 80.0–100.0)
Platelets: 202 10*3/uL (ref 150–400)
RBC: 4.05 MIL/uL (ref 3.87–5.11)
RDW: 12.2 % (ref 11.5–15.5)
WBC: 16.1 10*3/uL — ABNORMAL HIGH (ref 4.0–10.5)
nRBC: 0 % (ref 0.0–0.2)

## 2020-08-17 MED ORDER — FENTANYL-BUPIVACAINE-NACL 0.5-0.125-0.9 MG/250ML-% EP SOLN
EPIDURAL | Status: AC
  Filled 2020-08-17: qty 250

## 2020-08-17 MED ORDER — LACTATED RINGERS IV SOLN
500.0000 mL | Freq: Once | INTRAVENOUS | Status: AC
  Administered 2020-08-17: 500 mL via INTRAVENOUS

## 2020-08-17 MED ORDER — ACETAMINOPHEN 325 MG PO TABS: 650.0000 mg | ORAL_TABLET | ORAL | Status: DC | PRN

## 2020-08-17 MED ORDER — SOD CITRATE-CITRIC ACID 500-334 MG/5ML PO SOLN: 30.0000 mL | ORAL | Status: DC | PRN

## 2020-08-17 MED ORDER — EPHEDRINE 5 MG/ML INJ: 10.0000 mg | INTRAVENOUS | Status: DC | PRN

## 2020-08-17 MED ORDER — LACTATED RINGERS IV BOLUS: 1000.0000 mL | Freq: Once | INTRAVENOUS | Status: DC

## 2020-08-17 MED ORDER — FENTANYL CITRATE (PF) 100 MCG/2ML IJ SOLN
100.0000 ug | INTRAMUSCULAR | Status: DC | PRN
  Filled 2020-08-17: qty 2

## 2020-08-17 MED ORDER — FENTANYL-BUPIVACAINE-NACL 0.5-0.125-0.9 MG/250ML-% EP SOLN: 12.0000 mL/h | EPIDURAL | Status: DC | PRN

## 2020-08-17 MED ORDER — PHENYLEPHRINE 40 MCG/ML (10ML) SYRINGE FOR IV PUSH (FOR BLOOD PRESSURE SUPPORT)
80.0000 ug | PREFILLED_SYRINGE | INTRAVENOUS | Status: DC | PRN
  Filled 2020-08-17: qty 10

## 2020-08-17 MED ORDER — LIDOCAINE HCL (PF) 1 % IJ SOLN
30.0000 mL | INTRAMUSCULAR | Status: AC | PRN
  Administered 2020-08-18: 30 mL via SUBCUTANEOUS
  Filled 2020-08-17: qty 30

## 2020-08-17 MED ORDER — FLEET ENEMA 7-19 GM/118ML RE ENEM: 1.0000 | ENEMA | RECTAL | Status: DC | PRN

## 2020-08-17 MED ORDER — OXYCODONE-ACETAMINOPHEN 5-325 MG PO TABS: 2.0000 | ORAL_TABLET | ORAL | Status: DC | PRN

## 2020-08-17 MED ORDER — LACTATED RINGERS IV SOLN
500.0000 mL | INTRAVENOUS | Status: DC | PRN
  Administered 2020-08-17: 500 mL via INTRAVENOUS

## 2020-08-17 MED ORDER — LACTATED RINGERS IV SOLN: INTRAVENOUS | Status: DC

## 2020-08-17 MED ORDER — OXYTOCIN-SODIUM CHLORIDE 30-0.9 UT/500ML-% IV SOLN
2.5000 [IU]/h | INTRAVENOUS | Status: DC
  Filled 2020-08-17: qty 500

## 2020-08-17 MED ORDER — LIDOCAINE HCL (PF) 1 % IJ SOLN
INTRAMUSCULAR | Status: DC | PRN
  Administered 2020-08-17: 2 mL via EPIDURAL
  Administered 2020-08-17: 10 mL via EPIDURAL

## 2020-08-17 MED ORDER — FENTANYL CITRATE (PF) 100 MCG/2ML IJ SOLN
100.0000 ug | INTRAMUSCULAR | Status: DC | PRN
  Administered 2020-08-17: 100 ug via INTRAMUSCULAR

## 2020-08-17 MED ORDER — SODIUM CHLORIDE (PF) 0.9 % IJ SOLN
INTRAMUSCULAR | Status: DC | PRN
  Administered 2020-08-17: 12 mL/h via EPIDURAL

## 2020-08-17 MED ORDER — DIPHENHYDRAMINE HCL 50 MG/ML IJ SOLN: 12.5000 mg | INTRAMUSCULAR | Status: DC | PRN

## 2020-08-17 MED ORDER — EPHEDRINE 5 MG/ML INJ
10.0000 mg | INTRAVENOUS | Status: AC | PRN
  Administered 2020-08-17 (×2): 10 mg via INTRAVENOUS
  Filled 2020-08-17: qty 10

## 2020-08-17 MED ORDER — OXYCODONE-ACETAMINOPHEN 5-325 MG PO TABS: 1.0000 | ORAL_TABLET | ORAL | Status: DC | PRN

## 2020-08-17 MED ORDER — PHENYLEPHRINE 40 MCG/ML (10ML) SYRINGE FOR IV PUSH (FOR BLOOD PRESSURE SUPPORT)
80.0000 ug | PREFILLED_SYRINGE | INTRAVENOUS | Status: AC | PRN
  Administered 2020-08-17 (×3): 80 ug via INTRAVENOUS

## 2020-08-17 MED ORDER — ONDANSETRON HCL 4 MG/2ML IJ SOLN
4.0000 mg | Freq: Four times a day (QID) | INTRAMUSCULAR | Status: DC | PRN
  Administered 2020-08-17 – 2020-08-18 (×2): 4 mg via INTRAVENOUS
  Filled 2020-08-17 (×2): qty 2

## 2020-08-17 MED ORDER — OXYTOCIN BOLUS FROM INFUSION
333.0000 mL | Freq: Once | INTRAVENOUS | Status: AC
  Administered 2020-08-18: 333 mL via INTRAVENOUS

## 2020-08-17 NOTE — Anesthesia Preprocedure Evaluation (Signed)
Anesthesia Evaluation  Patient identified by MRN, date of birth, ID band Patient awake    Reviewed: Allergy & Precautions, Patient's Chart, lab work & pertinent test results  Airway Mallampati: II  TM Distance: >3 FB Neck ROM: Full    Dental no notable dental hx.    Pulmonary former smoker,    Pulmonary exam normal breath sounds clear to auscultation       Cardiovascular negative cardio ROS Normal cardiovascular exam Rhythm:Regular Rate:Normal     Neuro/Psych negative neurological ROS  negative psych ROS   GI/Hepatic negative GI ROS, Neg liver ROS,   Endo/Other  negative endocrine ROS  Renal/GU negative Renal ROS  negative genitourinary   Musculoskeletal negative musculoskeletal ROS (+)   Abdominal   Peds negative pediatric ROS (+)  Hematology negative hematology ROS (+) hct 36.7, plt 202   Anesthesia Other Findings   Reproductive/Obstetrics (+) Pregnancy                             Anesthesia Physical Anesthesia Plan  ASA: II and emergent  Anesthesia Plan: Epidural   Post-op Pain Management:    Induction:   PONV Risk Score and Plan: 2  Airway Management Planned: Natural Airway  Additional Equipment: None  Intra-op Plan:   Post-operative Plan:   Informed Consent: I have reviewed the patients History and Physical, chart, labs and discussed the procedure including the risks, benefits and alternatives for the proposed anesthesia with the patient or authorized representative who has indicated his/her understanding and acceptance.       Plan Discussed with:   Anesthesia Plan Comments:         Anesthesia Quick Evaluation

## 2020-08-17 NOTE — Anesthesia Procedure Notes (Signed)
Epidural Patient location during procedure: OB Start time: 08/17/2020 8:19 PM End time: 08/17/2020 8:30 PM  Staffing Anesthesiologist: Lannie Fields, DO Performed: anesthesiologist   Preanesthetic Checklist Completed: patient identified, IV checked, risks and benefits discussed, monitors and equipment checked, pre-op evaluation and timeout performed  Epidural Patient position: sitting Prep: DuraPrep and site prepped and draped Patient monitoring: continuous pulse ox, blood pressure, heart rate and cardiac monitor Approach: midline Location: L3-L4 Injection technique: LOR air  Needle:  Needle type: Tuohy  Needle gauge: 17 G Needle length: 9 cm Needle insertion depth: 5.5 cm Catheter type: closed end flexible Catheter size: 19 Gauge Catheter at skin depth: 11 cm Test dose: negative  Assessment Sensory level: T8 Events: blood not aspirated, injection not painful, no injection resistance, no paresthesia and negative IV test  Additional Notes Patient identified. Risks/Benefits/Options discussed with patient including but not limited to bleeding, infection, nerve damage, paralysis, failed block, incomplete pain control, headache, blood pressure changes, nausea, vomiting, reactions to medication both or allergic, itching and postpartum back pain. Confirmed with bedside nurse the patient's most recent platelet count. Confirmed with patient that they are not currently taking any anticoagulation, have any bleeding history or any family history of bleeding disorders. Patient expressed understanding and wished to proceed. All questions were answered. Sterile technique was used throughout the entire procedure. Please see nursing notes for vital signs. Test dose was given through epidural catheter and negative prior to continuing to dose epidural or start infusion. Warning signs of high block given to the patient including shortness of breath, tingling/numbness in hands, complete motor  block, or any concerning symptoms with instructions to call for help. Patient was given instructions on fall risk and not to get out of bed. All questions and concerns addressed with instructions to call with any issues or inadequate analgesia.  Reason for block:procedure for pain

## 2020-08-17 NOTE — H&P (Signed)
Ariel Stephens is a 29 y.o. female presenting for contractions and possible leaking of fluid since 1400.  Nursing Staff Provider  Office Location  [place] Dating  Bedside U/S  Language  English Anatomy US   normal  Flu Vaccine  06/07/20 Genetic Screen  NIPS:Declined   AFP:   First Screen:  Quad:    TDaP vaccine   06/07/20 Hgb A1C or  GTT Early  Third trimester: Normal   Glucose, Fasting, Gest 65 - 91 mg/dL 76   Glucose, 1 Hr, Gest 65 - 179 mg/dL 818   Glucose, 2 Hr, Gest 65 - 152 mg/dL 299     Rhogam  n/a   LAB RESULTS     Blood Type O/RH(D) POSITIVE/-- (06/04 0847)   Feeding Plan Breast  Antibody NO ANTIBODIES DETECTED (06/04 0847)  Contraception IP Mirena? Rubella 7.69 (06/04 0847)  Circumcision Girl  RPR NON-REACTIVE (06/04 0847)   Pediatrician  Looking into it.  HBsAg NON-REACTIVE (06/04 0847)   Support Person Peyton Najjar HCVAb Negative  Prenatal Classes Info given  HIV NON-REACTIVE (06/04 0847)     BTL Consent NA GBS Negative  VBAC Consent NA Pap 2019- Normal     Hgb Electro  Neg  BP Cuff Given 02/16/20 CF     SMA     Waterbirth  [ ]  Class [ ]  Consent [ ]  CNM visit    Induction  [ ]  Orders Entered [ ] Foley Y/N   OB History    Gravida  1   Para      Term      Preterm      AB      Living        SAB      TAB      Ectopic      Multiple      Live Births             Past Medical History:  Diagnosis Date  . 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 Was focal on endocervical margin. Three subsequent pap smears were normal.  . CIN III (cervical intraepithelial neoplasia III) 04/23/2011  . Vaginal Pap smear, abnormal    HGSIL   Past Surgical History:  Procedure Laterality Date  . LEEP    . WISDOM TOOTH EXTRACTION     Family History: family history includes Clotting disorder in her father; Heart attack in her father; Hypertension in her mother; Stroke in her father. Social History:  reports that she quit smoking about 10 years  ago. She has never used smokeless tobacco. She reports that she does not drink alcohol and does not use drugs.     Maternal Diabetes: No Genetic Screening: Declined Maternal Ultrasounds/Referrals: Normal Fetal Ultrasounds or other Referrals:  None Maternal Substance Abuse:  No Significant Maternal Medications:  None Significant Maternal Lab Results:  Group B Strep negative Other Comments:  None  Review of Systems  Constitutional: Negative for chills and fever.  Eyes: Negative for visual disturbance.  Respiratory: Negative for cough and shortness of breath.   Gastrointestinal: Positive for abdominal pain (contractions only). Negative for nausea and vomiting.  Genitourinary: Positive for vaginal bleeding (heavy bloody show).       Leaking clear fluid  Neurological: Negative for headaches.   Maternal Medical History:  Reason for admission: Rupture of membranes, contractions and vaginal bleeding.  Nausea.  Fetal activity: Perceived fetal activity is normal.    Prenatal complications: No PIH.  Prenatal Complications - Diabetes: none.    Dilation: 5 Effacement (%): 80 Station: -2 Exam by:: V.Jazelle Achey,CNM Blood pressure 109/68, pulse 87, temperature 98.4 F (36.9 C), temperature source Oral, resp. rate 16, weight 74.2 kg, last menstrual period 11/27/2019, SpO2 100 %. Maternal Exam:  Uterine Assessment: Contraction strength is firm.  Contraction frequency is regular.   Abdomen: Patient reports no abdominal tenderness. Estimated fetal weight is 8 lb.   Fetal presentation: vertex  Introitus: Normal vulva. Vulva is negative for lesion.  Normal vagina.  Ferning test: positive.  Amniotic fluid character: clear.  Pelvis: adequate for delivery.   Cervix: Cervix evaluated by sterile speculum exam and digital exam.     Fetal Exam Fetal Monitor Review: Mode: ultrasound.   Baseline rate: 145.  Variability: moderate (6-25 bpm).   Pattern: accelerations present and no decelerations.     Fetal State Assessment: Category I - tracings are normal.     Physical Exam Vitals and nursing note reviewed. Exam conducted with a chaperone present.  Constitutional:      General: She is in acute distress.     Appearance: She is not ill-appearing or toxic-appearing.  HENT:     Head: Normocephalic.  Eyes:     Conjunctiva/sclera: Conjunctivae normal.  Cardiovascular:     Rate and Rhythm: Normal rate.     Heart sounds: Normal heart sounds.  Pulmonary:     Effort: Pulmonary effort is normal. No respiratory distress.  Abdominal:     General: Abdomen is flat.     Tenderness: There is no abdominal tenderness.  Genitourinary:    General: Normal vulva.  Vulva is no lesion.     Comments: Unable to determine cervical dilation by digital exam from three experienced providers. Dilation easily visible of Sterile Spec exam.  Skin:    General: Skin is warm and dry.     Coloration: Skin is not pale.  Neurological:     Mental Status: She is alert and oriented to person, place, and time.     Deep Tendon Reflexes: Reflexes normal.  Psychiatric:        Mood and Affect: Mood normal.     Prenatal labs: ABO, Rh: O/RH(D) POSITIVE/-- (06/04 0847) Antibody: NO ANTIBODIES DETECTED (06/04 0847) Rubella: 7.69 (06/04 0847) RPR: NON-REACTIVE (09/24 0847)  HBsAg: NON-REACTIVE (06/04 0847)  HIV: NON-REACTIVE (09/24 0847)  GBS:   Neg  Assessment: 1. Labor: Active, SROM x 5 hours 2. Fetal Wellbeing: Category I  3. Pain Control: Fentanyl 4. GBS: Neg 5. 38.3 week IUP  Plan:  1. Admit to BS per consult with MD 2. Routine L&D orders 3. Analgesia/anesthesia PRN    Dorathy Kinsman 08/17/2020, 7:31 PM

## 2020-08-17 NOTE — MAU Note (Signed)
Ariel Stephens is a 29 y.o. at [redacted]w[redacted]d here in MAU reporting: contractions started this AM, they have gotten more consistent and painful throughout the day. States having a little bit of pink bleeding. For the past 2-3 hours has noticed a little bit of a trickle. +FM  Onset of complaint: today  Pain score: 9/10  Vitals:   08/17/20 1647  BP: 109/68  Pulse: 87  Resp: 16  Temp: 98.4 F (36.9 C)  SpO2: 100%     FHT: +FM  Lab orders placed from triage: none

## 2020-08-17 NOTE — Plan of Care (Signed)

## 2020-08-18 ENCOUNTER — Encounter (HOSPITAL_COMMUNITY): Payer: Self-pay | Admitting: Obstetrics and Gynecology

## 2020-08-18 DIAGNOSIS — Z3A38 38 weeks gestation of pregnancy: Secondary | ICD-10-CM

## 2020-08-18 DIAGNOSIS — O4290 Premature rupture of membranes, unspecified as to length of time between rupture and onset of labor, unspecified weeks of gestation: Secondary | ICD-10-CM | POA: Diagnosis present

## 2020-08-18 HISTORY — DX: Premature rupture of membranes, unspecified as to length of time between rupture and onset of labor, unspecified weeks of gestation: O42.90

## 2020-08-18 LAB — RPR: RPR Ser Ql: NONREACTIVE

## 2020-08-18 MED ORDER — IBUPROFEN 600 MG PO TABS
600.0000 mg | ORAL_TABLET | Freq: Four times a day (QID) | ORAL | Status: DC
  Administered 2020-08-18 – 2020-08-20 (×8): 600 mg via ORAL
  Filled 2020-08-18 (×7): qty 1

## 2020-08-18 MED ORDER — MAGNESIUM HYDROXIDE 400 MG/5ML PO SUSP: 30.0000 mL | ORAL | Status: DC | PRN

## 2020-08-18 MED ORDER — DIPHENHYDRAMINE HCL 25 MG PO CAPS: 25.0000 mg | ORAL_CAPSULE | Freq: Four times a day (QID) | ORAL | Status: DC | PRN

## 2020-08-18 MED ORDER — BENZOCAINE-MENTHOL 20-0.5 % EX AERO
1.0000 "application " | INHALATION_SPRAY | CUTANEOUS | Status: DC | PRN
  Administered 2020-08-18: 1 via TOPICAL
  Filled 2020-08-18: qty 56

## 2020-08-18 MED ORDER — ACETAMINOPHEN 325 MG PO TABS: 650.0000 mg | ORAL_TABLET | ORAL | Status: DC | PRN

## 2020-08-18 MED ORDER — OXYTOCIN-SODIUM CHLORIDE 30-0.9 UT/500ML-% IV SOLN: 1.0000 m[IU]/min | INTRAVENOUS | Status: DC

## 2020-08-18 MED ORDER — SIMETHICONE 80 MG PO CHEW: 80.0000 mg | CHEWABLE_TABLET | ORAL | Status: DC | PRN

## 2020-08-18 MED ORDER — COCONUT OIL OIL
1.0000 "application " | TOPICAL_OIL | Status: DC | PRN
  Administered 2020-08-19: 1 via TOPICAL

## 2020-08-18 MED ORDER — ZOLPIDEM TARTRATE 5 MG PO TABS: 5.0000 mg | ORAL_TABLET | Freq: Every evening | ORAL | Status: DC | PRN

## 2020-08-18 MED ORDER — MEASLES, MUMPS & RUBELLA VAC IJ SOLR: 0.5000 mL | Freq: Once | INTRAMUSCULAR | Status: DC

## 2020-08-18 MED ORDER — PRENATAL MULTIVITAMIN CH
1.0000 | ORAL_TABLET | Freq: Every day | ORAL | Status: DC
  Administered 2020-08-19 – 2020-08-20 (×2): 1 via ORAL
  Filled 2020-08-18 (×2): qty 1

## 2020-08-18 MED ORDER — SODIUM CHLORIDE 0.9 % IV SOLN
3.0000 g | Freq: Four times a day (QID) | INTRAVENOUS | Status: AC
  Administered 2020-08-18 – 2020-08-19 (×4): 3 g via INTRAVENOUS
  Filled 2020-08-18: qty 8
  Filled 2020-08-18 (×2): qty 3
  Filled 2020-08-18: qty 8

## 2020-08-18 MED ORDER — ONDANSETRON HCL 4 MG/2ML IJ SOLN: 4.0000 mg | INTRAMUSCULAR | Status: DC | PRN

## 2020-08-18 MED ORDER — TERBUTALINE SULFATE 1 MG/ML IJ SOLN: 0.2500 mg | Freq: Once | INTRAMUSCULAR | Status: DC | PRN

## 2020-08-18 MED ORDER — METHYLERGONOVINE MALEATE 0.2 MG PO TABS
0.2000 mg | ORAL_TABLET | Freq: Four times a day (QID) | ORAL | Status: AC
  Administered 2020-08-18 – 2020-08-19 (×4): 0.2 mg via ORAL
  Filled 2020-08-18 (×4): qty 1

## 2020-08-18 MED ORDER — WITCH HAZEL-GLYCERIN EX PADS: 1.0000 "application " | MEDICATED_PAD | CUTANEOUS | Status: DC | PRN

## 2020-08-18 MED ORDER — OXYTOCIN-SODIUM CHLORIDE 30-0.9 UT/500ML-% IV SOLN
1.0000 m[IU]/min | INTRAVENOUS | Status: DC
  Administered 2020-08-18: 2 m[IU]/min via INTRAVENOUS

## 2020-08-18 MED ORDER — DIBUCAINE (PERIANAL) 1 % EX OINT: 1.0000 "application " | TOPICAL_OINTMENT | CUTANEOUS | Status: DC | PRN

## 2020-08-18 MED ORDER — ONDANSETRON HCL 4 MG PO TABS: 4.0000 mg | ORAL_TABLET | ORAL | Status: DC | PRN

## 2020-08-18 MED ORDER — LEVONORGESTREL 19.5 MCG/DAY IU IUD
INTRAUTERINE_SYSTEM | Freq: Once | INTRAUTERINE | Status: DC
  Filled 2020-08-18: qty 1

## 2020-08-18 MED ORDER — OXYCODONE-ACETAMINOPHEN 5-325 MG PO TABS: 1.0000 | ORAL_TABLET | ORAL | Status: DC | PRN

## 2020-08-18 MED ORDER — OXYCODONE-ACETAMINOPHEN 5-325 MG PO TABS: 2.0000 | ORAL_TABLET | ORAL | Status: DC | PRN

## 2020-08-18 MED ORDER — FERROUS SULFATE 325 (65 FE) MG PO TABS
325.0000 mg | ORAL_TABLET | Freq: Two times a day (BID) | ORAL | Status: DC
  Administered 2020-08-18 – 2020-08-20 (×4): 325 mg via ORAL
  Filled 2020-08-18 (×5): qty 1

## 2020-08-18 MED ORDER — TETANUS-DIPHTH-ACELL PERTUSSIS 5-2.5-18.5 LF-MCG/0.5 IM SUSY: 0.5000 mL | PREFILLED_SYRINGE | Freq: Once | INTRAMUSCULAR | Status: DC

## 2020-08-18 NOTE — Lactation Note (Signed)
This note was copied from a baby's chart. Lactation Consultation Note  Patient Name: Ariel Stephens [id number] Date: 08/18/2020   Initial visit at 3 hours of life. Mom is a P1 who reported + breast changes w/pregnancy.   Infant had recently been latched by RN when I entered the room. Infant had a sleepy feed; Mom was taught the sound of swallows. Soon, infant was ready to latch to the other breast, infant latched with ease, assisted by lactation student. Infant fed more vigorously and swallows were immediately noted (and verified by cervical auscultation).   Expected feeding frequency during the 1st 24 hours and over the next few days discussed. Skin-to-skin encouraged. Mom without additional questions at this time.  Mom was made aware of O/P services, breastfeeding support groups, and our phone # for post-discharge questions.   Lurline Hare [facility] 08/18/2020, 1:46 PM

## 2020-08-18 NOTE — Anesthesia Postprocedure Evaluation (Signed)
Anesthesia Post Note  Patient: LENNYN GANGE  Procedure(s) Performed: AN AD HOC LABOR EPIDURAL     Patient location during evaluation: Mother Baby Anesthesia Type: Epidural Level of consciousness: awake and alert and oriented Pain management: satisfactory to patient Vital Signs Assessment: post-procedure vital signs reviewed and stable Respiratory status: spontaneous breathing and nonlabored ventilation Cardiovascular status: stable Postop Assessment: no headache, no backache, no signs of nausea or vomiting, adequate PO intake, patient able to bend at knees and able to ambulate (patient up walking) Anesthetic complications: no   No complications documented.  Last Vitals:  Vitals:   08/18/20 1310 08/18/20 1716  BP: 124/78 115/75  Pulse: 83 77  Resp: 18 18  Temp: 37.3 C 37.1 C  SpO2:      Last Pain:  Vitals:   08/18/20 1716  TempSrc: Oral  PainSc: 0-No pain   Pain Goal:                   Ulices Maack

## 2020-08-18 NOTE — Discharge Summary (Signed)
Postpartum Discharge Summary    Patient Name: Ariel Stephens DOB: 1991-09-03 MRN: [id number]  Date of admission: 08/17/2020 Delivery date:08/18/2020  Delivering provider: Manya Silvas  Date of discharge: 08/20/2020  Admitting diagnosis: Normal labor [O80, Z37.9] Intrauterine pregnancy: [redacted]w[redacted]d    Secondary diagnosis:  Active Problems:   Leakage of amniotic fluid   Labor and delivery indication for care or intervention   Vaginal delivery   Intrapartum fever  Additional problems: none    Discharge diagnosis: Term Pregnancy Delivered                                              Post partum procedures:none Augmentation: Pitocin Complications: Intrauterine Inflammation or infection (Chorioamniotis)  Hospital course: Onset of Labor With Vaginal Delivery      29y.o. yo G1P0 at 379w4das admitted in Active Labor on 08/17/2020. Patient had an uncomplicated labor course as follows:  Membrane Rupture Time/Date: 2:00 PM ,08/17/2020   Delivery Method:Vaginal, Spontaneous  Episiotomy: None  Lacerations:  2nd degree  Patient had an uncomplicated postpartum course.  She is ambulating, tolerating a regular diet, passing flatus, and urinating well. Patient is discharged home in stable condition on 08/20/20.  Newborn Data: Birth date:08/18/2020  Birth time:10:02 AM  Gender:Female  Living status:Living  Apgars:9 ,9  Weight:3535 g   Magnesium Sulfate received: No BMZ received: No Rhophylac:N/A MMR:N/A T-DaP:Given prenatally Flu: Yes Transfusion:No  Physical exam  Vitals:   08/19/20 0542 08/19/20 1353 08/19/20 2057 08/20/20 0517  BP: 107/70 106/72 103/65 101/66  Pulse: 75 73 72 63  Resp: _0 Temp: 97.8 F (36.6 C) 98.3 F (36.8 C) 97.8 F (36.6 C) 97.9 F (36.6 C)  TempSrc: Oral Oral Oral Oral  SpO2: 97% 96% 100% 100%  Weight:      Height:       General: alert, cooperative and no distress Lochia: appropriate Uterine Fundus: firm Incision: N/A DVT  Evaluation: No evidence of DVT seen on physical exam. No cords or calf tenderness. No significant calf/ankle edema. Labs: Lab Results  Component Value Date   WBC 16.1 (H) 08/17/2020   HGB 12.1 08/17/2020   HCT 36.7 08/17/2020   MCV 90.6 08/17/2020   PLT 202 08/17/2020   No flowsheet data found. Edinburgh Score: Edinburgh Postnatal Depression Scale Screening Tool 08/19/2020  I have been able to laugh and see the funny side of things. 0  I have looked forward with enjoyment to things. 0  I have blamed myself unnecessarily when things went wrong. 1  I have been anxious or worried for no good reason. 0  I have felt scared or panicky for no good reason. 0  Things have been getting on top of me. 0  I have been so unhappy that I have had difficulty sleeping. 0  I have felt sad or miserable. 0  I have been so unhappy that I have been crying. 0  The thought of harming myself has occurred to me. 0  Edinburgh Postnatal Depression Scale Total 1     After visit meds:  Allergies as of 08/20/2020      Reactions   Penicillins Rash      Medication List    TAKE these medications   ibuprofen 600 MG tablet Commonly known as: ADVIL Take 1 tablet (600 mg total) by mouth every 6 (  six) hours as needed for moderate pain or cramping.   norethindrone 0.35 MG tablet Commonly known as: MICRONOR Take 1 tablet (0.35 mg total) by mouth daily.   PRENATAL VITAMIN PO Take by mouth.        Discharge home in stable condition Infant Feeding: Breast Infant Disposition:home with mother Discharge instruction: per After Visit Summary and Postpartum booklet. Activity: Advance as tolerated. Pelvic rest for 6 weeks.  Diet: routine diet Future Appointments: No future appointments. Follow up Visit:  [facility] at [place]. Schedule an appointment as soon as possible for a visit in 4 week(s).   Specialty: Obstetrics and Gynecology Why: Make appointment  to be seen in 4 weeks for postpartum appointment and IUD insertion Contact information: [place], [place] [phone]               Please schedule this patient for a In person postpartum visit in 4 weeks with the following provider: Any provider. Additional Postpartum F/U:none  Low risk pregnancy complicated by: Intrapartum low-grade temp Delivery mode:  Vaginal, Spontaneous  Anticipated Birth Control:  IUD   08/20/2020 Lajean Manes, CNM

## 2020-08-18 NOTE — Progress Notes (Signed)
Foley catheter removed with 500 cc clear amber urine in bag.

## 2020-08-19 ENCOUNTER — Telehealth: Payer: Self-pay | Admitting: *Deleted

## 2020-08-19 NOTE — Progress Notes (Signed)
POSTPARTUM PROGRESS NOTE  Subjective: Ariel Stephens is a 29 y.o. G1P1001 s/p vaginal delivery at [redacted]w[redacted]d.  She reports she doing well. No acute events overnight. She denies any problems with ambulating, voiding or po intake. Denies nausea or vomiting. She has passed flatus. Pain is well controlled.  Lochia is minimal. Desires POPs to outpatient IUD.  Objective: Blood pressure 111/71, pulse 64, temperature 97.8 F (36.6 C), temperature source Oral, resp. rate 16, height 5\' 5"  (1.651 m), weight 73.9 kg, last menstrual period 11/27/2019, SpO2 99 %, unknown if currently breastfeeding.  Physical Exam:  General: alert, cooperative and no distress Chest: no respiratory distress Abdomen: soft, non-tender  Uterine Fundus: firm and at level of umbilicus Extremities: No calf swelling or tenderness  no edema  Recent Labs    08/17/20 1936  HGB 12.1  HCT 36.7    Assessment/Plan: Ariel Stephens is a 7 y.o. G1P1001 s/p vaginal delivery at [redacted]w[redacted]d for SOL s/p SROM.  Routine Postpartum Care: Doing well, pain well-controlled.  -- Continue routine care, lactation support  -- Contraception: IUD desired (deferred post-placental placement given c/f possible chorioamnionitis). Plan for POPs bridge to hormonal IUD placement at postpartum appt in clinic. -- Feeding: breast  Dispo: Plan for discharge PPD#2.  [redacted]w[redacted]d, MD OB Fellow, [facility] 08/19/2020 3:28 AM

## 2020-08-19 NOTE — Telephone Encounter (Signed)
Left patient a message to call and schedule 4 week Postpartum appointment, in office with any provider.

## 2020-08-19 NOTE — Lactation Note (Signed)
This note was copied from a baby's chart. Lactation Consultation Note  Patient Name: Ariel Stephens [id number] Date: 08/19/2020 Reason for consult: Follow-up assessment;Infant weight loss Baby is 29 hours  As LC entered the room per mom the baby just fed.  Per mom the baby is feeding well with swallows.  Breast are fuller,and nipples are sensitive.  LC recommended EBM to her nipple liberally and prior to latching  Breast massage , hand express, reverse pressure to elongate the nipple / areola complex.  LC reviewed and updated the doc flow sheets per mom.   Maternal Data    Feeding Feeding Type:  (just fed)  LATCH Score                   Interventions Interventions: Breast feeding basics reviewed  Lactation Tools Discussed/Used     Consult Status Consult Status: Follow-up Date: 08/20/20 Follow-up type: In-patient    Ariel Stephens 08/19/2020, 3:08 PM

## 2020-08-20 DIAGNOSIS — Z9889 Other specified postprocedural states: Secondary | ICD-10-CM

## 2020-08-20 LAB — SURGICAL PATHOLOGY

## 2020-08-20 MED ORDER — NORETHINDRONE 0.35 MG PO TABS: 1.0000 | ORAL_TABLET | Freq: Every day | ORAL | 3 refills | Status: DC

## 2020-08-20 MED ORDER — IBUPROFEN 600 MG PO TABS: 600.0000 mg | ORAL_TABLET | Freq: Four times a day (QID) | ORAL | 0 refills | Status: DC | PRN

## 2020-08-20 NOTE — Lactation Note (Signed)
This note was copied from a baby's chart. Lactation Consultation Note  Patient Name: Girl Sapna Padron [id number] Date: 08/20/2020 Reason for consult: Follow-up assessment;Primapara;1st time breastfeeding;Early term 37-38.6wks;Infant weight loss;Other (Comment);Nipple pain/trauma (7 % weight loss)  Baby is 46 hours old , P 1, elevated Skin Bili - 43 hours - 10.3 Baby latched when LC entered the room with depth and swallows noted.  Per mom breast feeding is going well and breast are fuller.  Sore nipple and engorgement prevention and tx reviewed.  LC provided shells while awake and comfort gels x 6 days/ alternating.  Per mom has a hand pump and DEBP at home.  Mom aware of the [facility] resources after D/C.    Maternal Data Has patient been taught Hand Expression?: Yes  Feeding Feeding Type:  (baby latched with depth / swallows noted)  LATCH Score Latch:  (latched with depth and swallows)  Audible Swallowing: Spontaneous and intermittent  Type of Nipple:  (nipple slightly slanted when baby released)  Comfort (Breast/Nipple):  (per mom breast are filling, nipples sensitive , no breakdown noted)  Hold (Positioning): Assistance needed to correctly position infant at breast and maintain latch.  LATCH Score: 9  Interventions Interventions: Breast feeding basics reviewed;Skin to skin;Breast compression;Shells;Coconut oil;Comfort gels  Lactation Tools Discussed/Used Tools: Shells;Coconut oil;Comfort gels Shell Type: Inverted Pump Review: Milk Storage   Consult Status Consult Status: Complete Date: 08/20/20    Kathrin Greathouse 08/20/2020, 8:33 AM

## 2020-08-22 ENCOUNTER — Telehealth: Payer: Self-pay | Admitting: *Deleted

## 2020-08-22 NOTE — Telephone Encounter (Signed)
Left patient an urgent message to call and schedule 4 week Postpartum appointment as soon as possible.

## 2020-08-23 ENCOUNTER — Encounter: Payer: Medicaid Other | Admitting: Obstetrics and Gynecology

## 2020-09-04 ENCOUNTER — Encounter (HOSPITAL_COMMUNITY): Payer: Self-pay | Admitting: Obstetrics and Gynecology

## 2020-09-19 NOTE — Progress Notes (Signed)
Post Partum Visit Note  Ariel Stephens is a 30 y.o. G91P1001 female who presents for a postpartum visit. She is 4 weeks postpartum following a normal spontaneous vaginal delivery with concern for retained membranes and methergine given x24 hours. Patient also had low grade ppartum fever which p recluded placement of IUD post-placentally and was given ABX.  I have fully reviewed the prenatal and intrapartum course. The delivery was at [redacted]w[redacted]d gestational weeks.  Anesthesia: epidural. Postpartum course has been unremarkable. Baby is doing well. Baby is feeding by breast. Bleeding staining only. Bowel function is normal. Bladder function is normal. Patient is sexually active. Contraception method desired is IUD. Postpartum depression screening: negative.   The pregnancy intention screening data noted above was reviewed. Potential methods of contraception were discussed. The patient elected to proceed with IUD or IUS.    Edinburgh Postnatal Depression Scale - 09/20/20 0932      Edinburgh Postnatal Depression Scale:  In the Past 7 Days   I have been able to laugh and see the funny side of things. 0    I have looked forward with enjoyment to things. 0    I have blamed myself unnecessarily when things went wrong. 1    I have been anxious or worried for no good reason. 0    I have felt scared or panicky for no good reason. 0    Things have been getting on top of me. 0    I have been so unhappy that I have had difficulty sleeping. 0    I have felt sad or miserable. 0    I have been so unhappy that I have been crying. 0    The thought of harming myself has occurred to me. 0    Edinburgh Postnatal Depression Scale Total 1            The following portions of the patient's history were reviewed and updated as appropriate: allergies, current medications, past family history, past medical history, past social history, past surgical history and problem list.  Review of Systems Pertinent items  noted in HPI and remainder of comprehensive ROS otherwise negative.    Objective:  BP 108/76   Pulse 96   Ht 5\' 5"  (1.651 m)   LMP 11/27/2019   Breastfeeding Yes   BMI 27.12 kg/m    General:  alert, cooperative and no distress   Breasts:  pt declines  Lungs: clear to auscultation bilaterally  Heart:  regular rate and rhythm, S1, S2 normal, no murmur, click, rub or gallop  Abdomen: soft, non-tender; bowel sounds normal; no masses,  no organomegaly   Vulva:  normal  Vagina: normal vagina  Cervix:  mostly flush with vaginal wall  Corpus: not examined  Adnexa:  not evaluated  Rectal Exam: Not performed.        Assessment:    Normal postpartum exam. Pap smear not done at today's visit. Last performed 08/2018 - normal.  Plan:   Essential components of care per ACOG recommendations:  1.  Mood and well being: Patient with negative depression screening today. Reviewed local resources for support.  - Patient does not use tobacco. - hx of drug use? No  2. Infant care and feeding:  -Patient currently breastmilk feeding? Yes If breastmilk feeding discussed return to work and pumping. If needed, patient was provided letter for work to allow for every 2-3 hr pumping breaks, and to be granted a private location to express breastmilk and refrigerated  area to store breastmilk. Reviewed importance of draining breast regularly to support lactation. -Social determinants of health (SDOH) reviewed in EPIC. No concerns  3. Sexuality, contraception and birth spacing - Patient does not want a pregnancy in the next year.  Desired family size is 1 children.  - Reviewed forms of contraception in tiered fashion. Patient desired IUD today.   - Discussed birth spacing of 18 months  4. Sleep and fatigue -Encouraged family/partner/community support of 4 hrs of uninterrupted sleep to help with mood and fatigue  5. Physical Recovery  - Discussed patients delivery and complications - Patient has urinary  incontinence? No - Patient is safe to resume physical and sexual activity  6.  Health Maintenance - Last pap smear done 08/2018 and was normal with negative HPV.  7. Chronic Disease - PCP follow up, list given  Marylen Ponto, NP [facility] [employer], [facility]

## 2020-09-20 ENCOUNTER — Other Ambulatory Visit (HOSPITAL_COMMUNITY)
Admission: RE | Admit: 2020-09-20 | Discharge: 2020-09-20 | Disposition: A | Discharge status: Home / Self Care | Payer: Medicaid Other | Source: Ambulatory Visit | Attending: Women's Health | Admitting: Women's Health

## 2020-09-20 ENCOUNTER — Encounter: Payer: Self-pay | Admitting: Women's Health

## 2020-09-20 ENCOUNTER — Ambulatory Visit (INDEPENDENT_AMBULATORY_CARE_PROVIDER_SITE_OTHER): Payer: Medicaid Other | Admitting: Women's Health

## 2020-09-20 ENCOUNTER — Other Ambulatory Visit: Payer: Self-pay

## 2020-09-20 VITALS — BP 108/76 | HR 96 | Ht 65.0 in

## 2020-09-20 DIAGNOSIS — Z30430 Encounter for insertion of intrauterine contraceptive device: Secondary | ICD-10-CM | POA: Diagnosis not present

## 2020-09-20 DIAGNOSIS — Z3202 Encounter for pregnancy test, result negative: Secondary | ICD-10-CM | POA: Diagnosis not present

## 2020-09-20 DIAGNOSIS — Z8759 Personal history of other complications of pregnancy, childbirth and the puerperium: Secondary | ICD-10-CM

## 2020-09-20 DIAGNOSIS — Z01812 Encounter for preprocedural laboratory examination: Secondary | ICD-10-CM

## 2020-09-20 LAB — POCT URINE PREGNANCY: Preg Test, Ur: NEGATIVE

## 2020-09-20 MED ORDER — LEVONORGESTREL 19.5 MCG/DAY IU IUD: INTRAUTERINE_SYSTEM | Freq: Once | INTRAUTERINE | Status: DC

## 2020-09-20 NOTE — Progress Notes (Signed)
Ms. Ariel Stephens is a 30 y.o. G1P1001 here for [place] IUD insertion with no c/o. Informed consent given to include what Penni Bombard is, how it works, effectiveness, return to fertility, benefits, side effects, risks, other choices of contraception, IUD placement process, removal, post-insertion expectations/care, RTO 4-6wks to check IUD. Informed consent signed. Patient delivered 08/18/2020, NSVD and wanted post-placental placement, but had post delivery fever and decision was made not to place. Patient was treated with ABX ppartum. Patient has had intercourse since delivery, but reports using norethindrone pills given at time of delivery since leaving hospital. UPT negative in office.  Pt has no medical history concerns to report and has allergy to PCN. Pt has no contraindications to IUD use. Questions answered to pt satisfaction. Last Pap done 08/2018. GC/CT done today.  Speculum inserted, cervix fully visualized. Betadine applied to cervix x2. Most cervix flush with vaginal wall with exception of small anterior lip. Patient then reports hx of LEEP procedure and difficulties with her cervix during delivery. Given limited placement for tenaculum, decision made to stop procedure and schedule patient with MD for placement. Patient agrees with plan.  Marylen Ponto, NP  10:28 AM 09/20/2020

## 2020-09-20 NOTE — Patient Instructions (Signed)
Intrauterine Device Information - [email or web address] An intrauterine device (IUD) is a medical device that is inserted in the uterus to prevent pregnancy. It is a small, T-shaped device that has one or two nylon strings hanging down from it. The strings hang out of the lower part of the uterus (cervix) to allow for future IUD removal. There are two types of IUDs available:  Hormone IUD. This type of IUD is made of plastic and contains the hormone progestin (synthetic progesterone). A hormone IUD may last 3-5 years.  Copper IUD. This type of IUD has copper wire wrapped around it. A copper IUD may last up to 10 years. How is an IUD inserted? An IUD is inserted through the vagina and placed into the uterus with a minor medical procedure. The exact procedure for IUD insertion may vary among health care providers and hospitals. How does an IUD work? Synthetic progesterone in a hormonal IUD prevents pregnancy by:  Thickening cervical mucus to prevent sperm from entering the uterus.  Thinning the uterine lining to prevent a fertilized egg from being implanted there. Copper in a copper IUD prevents pregnancy by making the uterus and fallopian tubes produce a fluid that kills sperm. What are the advantages of an IUD? Advantages of either type of IUD  It is highly effective in preventing pregnancy.  It is reversible. You can become pregnant shortly after the IUD is removed.  It is low-maintenance and can stay in place for a long time.  There are no estrogen-related side effects.  It can be used when breastfeeding.  It is not associated with weight gain.  It can be inserted right after childbirth, an abortion, or a miscarriage. Advantages of a hormone IUD  If it is inserted within 7 days of your period starting, it works right after it is inserted. If the hormone IUD is inserted at any other time in your cycle, you will need to use a backup method of birth control for 7 days after  insertion.  It can make menstrual periods lighter.  It can reduce menstrual cramping.  It can be used for 3-5 years. Advantages of a copper IUD  It works right after it is inserted.  It can be used as a form of emergency birth control if it is inserted within 5 days after having unprotected sex.  It does not interfere with your body's natural hormones.  It can be used for 10 years. What are the disadvantages of an IUD?  An IUD may cause irregular menstrual bleeding for a period of time after insertion.  You may have pain during insertion and have cramping and vaginal bleeding after insertion.  An IUD may cut the uterus (uterine perforation) when it is inserted. This is rare.  An IUD may cause pelvic inflammatory disease (PID), which is an infection in the uterus and fallopian tubes. This is rare, and it usually happens during the first 20 days after the IUD is inserted.  A copper IUD can make your menstrual flow heavier and more painful. How is an IUD removed?  You will lie on your back with your knees bent and your feet in footrests (stirrups).  A device will be inserted into your vagina to spread apart the vaginal walls (speculum). This will allow your health care provider to see the strings attached to the IUD.  Your health care provider will use a small instrument (forceps) to grasp the IUD strings and pull firmly until the IUD is removed. You may have  some discomfort when the IUD is removed. Your health care provider may recommend taking over-the-counter pain relievers, such as ibuprofen, before the procedure. You may also have minor spotting for a few days after the procedure. The exact procedure for IUD removal may vary among health care providers and hospitals. Is the IUD right for me? Your health care provider will make sure you are a good candidate for an IUD and will discuss the advantages, disadvantages, and possible side effects with you. Summary  An intrauterine  device (IUD) is a medical device that is inserted in the uterus to prevent pregnancy. It is a small, T-shaped device that has one or two nylon strings hanging down from it.  A hormone IUD contains the hormone progestin (synthetic progesterone). A copper IUD has copper wire wrapped around it.  Synthetic progesterone in a hormone IUD prevents pregnancy by thickening cervical mucus and thinning the walls of the uterus. Copper in a copper IUD prevents pregnancy by making the uterus and fallopian tubes produce a fluid that kills sperm.  A hormone IUD can be left in place for 3-5 years. A copper IUD can be left in place for up to 10 years.  An IUD is inserted and removed by a health care provider. You may feel some pain during insertion and removal. Your health care provider may recommend taking over-the-counter pain medicine, such as ibuprofen, before an IUD procedure. This information is not intended to replace advice given to you by your health care provider. Make sure you discuss any questions you have with your health care provider. Document Revised: 08/13/2017 Document Reviewed: 09/29/2016 Elsevier Patient Education  2020 [employer].       Levonorgestrel intrauterine device (IUD) What is this medicine? LEVONORGESTREL IUD (LEE voe nor jes trel) is a contraceptive (birth control) device. The device is placed inside the uterus by a healthcare professional. It is used to prevent pregnancy. This device can also be used to treat heavy bleeding that occurs during your period. This medicine may be used for other purposes; ask your health care provider or pharmacist if you have questions. COMMON BRAND NAME(S): Cameron Ali What should I tell my health care provider before I take this medicine? They need to know if you have any of these conditions:  abnormal Pap smear  cancer of the breast, uterus, or cervix  diabetes  endometritis  genital or pelvic infection now or in  the past  have more than one sexual partner or your partner has more than one partner  heart disease  history of an ectopic or tubal pregnancy  immune system problems  IUD in place  liver disease or tumor  problems with blood clots or take blood-thinners  seizures  use intravenous drugs  uterus of unusual shape  vaginal bleeding that has not been explained  an unusual or allergic reaction to levonorgestrel, other hormones, silicone, or polyethylene, medicines, foods, dyes, or preservatives  pregnant or trying to get pregnant  breast-feeding How should I use this medicine? This device is placed inside the uterus by a health care professional. Talk to your pediatrician regarding the use of this medicine in children. Special care may be needed. Overdosage: If you think you have taken too much of this medicine contact a poison control center or emergency room at once. NOTE: This medicine is only for you. Do not share this medicine with others. What if I miss a dose? This does not apply. Depending on the brand of device  you have inserted, the device will need to be replaced every 3 to 6 years if you wish to continue using this type of birth control. What may interact with this medicine? Do not take this medicine with any of the following medications:  amprenavir  bosentan  fosamprenavir This medicine may also interact with the following medications:  aprepitant  armodafinil  barbiturate medicines for inducing sleep or treating seizures  bexarotene  boceprevir  griseofulvin  medicines to treat seizures like carbamazepine, ethotoin, felbamate, oxcarbazepine, phenytoin, topiramate  modafinil  pioglitazone  rifabutin  rifampin  rifapentine  some medicines to treat HIV infection like atazanavir, efavirenz, indinavir, lopinavir, nelfinavir, tipranavir, ritonavir  St. John's wort  warfarin This list may not describe all possible interactions. Give your  health care provider a list of all the medicines, herbs, non-prescription drugs, or dietary supplements you use. Also tell them if you smoke, drink alcohol, or use illegal drugs. Some items may interact with your medicine. What should I watch for while using this medicine? Visit your doctor or health care professional for regular check ups. See your doctor if you or your partner has sexual contact with others, becomes HIV positive, or gets a sexual transmitted disease. This product does not protect you against HIV infection (AIDS) or other sexually transmitted diseases. You can check the placement of the IUD yourself by reaching up to the top of your vagina with clean fingers to feel the threads. Do not pull on the threads. It is a good habit to check placement after each menstrual period. Call your doctor right away if you feel more of the IUD than just the threads or if you cannot feel the threads at all. The IUD may come out by itself. You may become pregnant if the device comes out. If you notice that the IUD has come out use a backup birth control method like condoms and call your health care provider. Using tampons will not change the position of the IUD and are okay to use during your period. This IUD can be safely scanned with magnetic resonance imaging (MRI) only under specific conditions. Before you have an MRI, tell your healthcare provider that you have an IUD in place, and which type of IUD you have in place. What side effects may I notice from receiving this medicine? Side effects that you should report to your doctor or health care professional as soon as possible:  allergic reactions like skin rash, itching or hives, swelling of the face, lips, or tongue  fever, flu-like symptoms  genital sores  high blood pressure  no menstrual period for 6 weeks during use  pain, swelling, warmth in the leg  pelvic pain or tenderness  severe or sudden headache  signs of  pregnancy  stomach cramping  sudden shortness of breath  trouble with balance, talking, or walking  unusual vaginal bleeding, discharge  yellowing of the eyes or skin Side effects that usually do not require medical attention (report to your doctor or health care professional if they continue or are bothersome):  acne  breast pain  change in sex drive or performance  changes in weight  cramping, dizziness, or faintness while the device is being inserted  headache  irregular menstrual bleeding within first 3 to 6 months of use  nausea This list may not describe all possible side effects. Call your doctor for medical advice about side effects. You may report side effects to FDA at 1-800-FDA-1088. Where should I keep my medicine?  This does not apply. NOTE: This sheet is a summary. It may not cover all possible information. If you have questions about this medicine, talk to your doctor, pharmacist, or health care provider.  2020 Elsevier/Gold Standard (2018-07-12 13:22:01)       AREA FAMILY PRACTICE PHYSICIANS  [place] ([id number]) . [facility] o [address] [place] [place], [place] [address] o [phone] o Mon-Fri 8:30-12:30, 1:30-5:00 o Accepting Medicaid . [facility] Medicine at [facility] [address], [place], [place] [address] o [phone] o Mon-Fri 8:00-5:30 . [facility] [employer] o [address], [place], [place] [address] o [phone], Tue, Thur, Fri 8:30-5:00, Wed 10:00-7:00 (closed 1-2pm) o Accepting Medicaid . [facility] o [address], [place], [place]  [address] o Phone - [phone]   Fax - [phone]  [place] [phone]) . [facility] o [address] [place], [place] [address] o [phone] o Mon-Fri 8:00-5:00 . [facility] - Pediatrics at [facility])  o [address]  [place] [place], [place] [address] o [phone] o Mon-Fri 8:30-5:30, Sat (Oct.-Mar.) 9:00-1:00 o Accepting [facility] [phone]) . [facility] at [facility] o [address], [place], [place] [address] o [phone] o Mon-Fri 8:00-5:00  [place] [phone]) . [facility] at [facility] o [address], [place], [place] [address] o [phone] o Mon-Fri 8:00-5:00 . Nature conservation officer at [place] o [address] [place], [place], [place] [address] o [phone] o Mon-Fri 8:00-5:00 . Nature conservation officer at [employer] o [address] [place], [place] [address] o [phone] o Mon-Fri 8:00-5:00 . [facility] o [address] [place] [address] o [phone] o Mon-Fri 7:30-5:30  [place] [phone] & [phone]) . [facility] o [address] [place], [place] [address] o [phone] o Mon-Thur 8:00-6:00 o Accepting Medicaid . [facility] Medicine o [address] [place], [place] [address] o [phone] o Mon-Thur 7:30-7:30, Fri 7:30-4:30 o Accepting Medicaid . [facility] at [facility] o [phone] [address] [place], [place]  [address] o [phone]   Fax - [phone]  [place] [phone] & [phone]) . Nature conservation officer at [employer] o [address] [place], [place] [address] o [phone] o Mon-Fri 7:00-5:00 . [facility] o [address], [place], [place] [address] o [phone] o Mon-Fri 8:00-5:00 o Accepting Medicaid . [facility] - [facility] o [address] [place], [place] [address] o [phone] o Mon-Fri 8:00-5:00 o Accepting Medicaid  [facility] [place] [phone]) . [facility] Primary Care at [facility] o [address] [place], [place] [address] o [phone] o Mon-Fri 8:00-5:00 . [facility] - [facility] at [employer]) o [address], [place], [place] [address] o [phone] o Mon-Fri 8:00-5:00 o Accepting Medicaid . [facility] - [facility] at [employer]) o [address], [place], [place] [address] o [phone] o Mon-Fri 8:00-5:30, Sat&Sun by appointment (phones open at 8:30) o Accepting [facility] [phone] & [phone]) . [facility] o [address], [place], [place] [address] o [phone] o Mon-Thur 8:00-7:00, Fri 8:00-5:00, Sat 8:00-12:00, Sun 9:00-12:00 o Accepting Medicaid . [facility] Family Medicine at [facility] [address], [place], [place]  [address] o [phone] o Mon-Thur 8:00-5:00 o Accepting Medicaid . [facility] at [place] o [address] [place] [place], [place] [address] o [phone] o Mon-Fri 8:00-5:30, Sat (Oct.-Mar.) 9:00-1:00 o Accepting [employer] [phone]) . [facility] o [address] [place], [place] [address] o [phone] o Mon-Fri 8:00-5:00 o Accepting Medicaid   [facility] [phone]) . [facility] at [facility] o [address], [place], [place] [address] o [phone] o Mon-Fri 8:00-5:00 . Nature conservation officer at [facility] o [address], [place], [place] [address] o [phone] o Mon-Fri 8:00-5:00 . [facility] - [place] o [address], [place], [place] [address] o [phone] o Mon-Fri 8:00-5:00 o After hours clinic [facility] [place], [place] [address]) [phone] Mon-Fri 5:00-8:00, Sat 12:00-6:00, Sun 10:00-4:00 o Accepting Medicaid . [facility] Medicine at [facility] o [address] [place], [place]  [address] o [phone]   Fax - [phone]  [place] [phone]) . Nature conservation officer at [facility] o [address]  [place], [place] [address] o [phone] o Mon-Fri 8:00-5:00 . [facility] - [place] [facility]) o [address] [place], [place] [address] o [phone] o Mon-Thur 8:00-7:00, Fri 8:00-5:00, Sat 8:00-12:00

## 2020-09-23 LAB — CERVICOVAGINAL ANCILLARY ONLY
Chlamydia: NEGATIVE
Comment: NEGATIVE
Comment: NORMAL
Neisseria Gonorrhea: NEGATIVE

## 2020-10-09 ENCOUNTER — Ambulatory Visit: Payer: Medicaid Other | Admitting: Obstetrics & Gynecology

## 2021-01-07 ENCOUNTER — Telehealth: Payer: Self-pay

## 2021-01-07 DIAGNOSIS — Z789 Other specified health status: Secondary | ICD-10-CM

## 2021-01-07 MED ORDER — NORETHINDRONE 0.35 MG PO TABS: 1.0000 | ORAL_TABLET | Freq: Every day | ORAL | 0 refills | Status: DC

## 2021-01-07 NOTE — Telephone Encounter (Signed)
Pt called needing refill of OCP until appt on 01/16/21. Refill sent.

## 2021-01-16 ENCOUNTER — Ambulatory Visit: Payer: Medicaid Other | Admitting: Obstetrics & Gynecology

## 2021-01-16 ENCOUNTER — Other Ambulatory Visit: Payer: Self-pay

## 2021-01-16 ENCOUNTER — Encounter: Payer: Self-pay | Admitting: Obstetrics & Gynecology

## 2021-01-16 VITALS — BP 114/69 | HR 75 | Resp 16 | Ht 65.0 in | Wt 140.0 lb

## 2021-01-16 DIAGNOSIS — Z30430 Encounter for insertion of intrauterine contraceptive device: Secondary | ICD-10-CM

## 2021-01-16 DIAGNOSIS — Z3009 Encounter for other general counseling and advice on contraception: Secondary | ICD-10-CM

## 2021-01-16 LAB — POCT URINE PREGNANCY: Preg Test, Ur: NEGATIVE

## 2021-01-16 MED ORDER — LEVONORGESTREL 20.1 MCG/DAY IU IUD
1.0000 | INTRAUTERINE_SYSTEM | Freq: Once | INTRAUTERINE | Status: DC
Start: 2021-01-16 — End: 2021-01-16

## 2021-01-16 MED ORDER — LEVONORGESTREL-ETHINYL ESTRAD 0.1-20 MG-MCG PO TABS
1.0000 | ORAL_TABLET | Freq: Every day | ORAL | 3 refills | Status: DC
Start: 2021-01-16 — End: 2021-01-16

## 2021-01-17 ENCOUNTER — Encounter: Payer: Self-pay | Admitting: Obstetrics & Gynecology

## 2021-01-17 NOTE — Progress Notes (Signed)
GYNECOLOGY  VISIT  CC:   iud insertion/contraception discussion  HPI: 30 y.o. [id number] Single White or Caucasian female here for IUD insertion.  Pt is unsure if this is actually what she wants to do.  Had planned post partum but had fever during delivery and placement was delayed until post partum time.  Then placement attempted at 09/20/2020 visit but due to small cervix, appointment with MD recommended.  Pt has considered options and wants to be on OCPs.  No longer breast feeding.  Reviewed prior OCPs she took in past.  She is fine with any of these.  Aware if misses pills, this is less effective than IUD.  Risks discussed with pt in detail including DUB, DVT/PE, headache, nausea, increased BP. Pt doesn't smoke and has no hx DVT.  Feels like cycle is getting ready to start now.  Patient Active Problem List   Diagnosis Date Noted  . Supervision of normal first pregnancy 02/16/2020  . History of severe cervical dysplasia 06/25/2016    Past Medical History:  Diagnosis Date  . 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 Was focal on endocervical margin. Three subsequent pap smears were normal.  . CIN III (cervical intraepithelial neoplasia III) 04/23/2011  . Intrapartum fever 08/18/2020  . Labor and delivery indication for care or intervention 08/18/2020  . Leakage of amniotic fluid 08/18/2020    Past Surgical History:  Procedure Laterality Date  . LEEP    . WISDOM TOOTH EXTRACTION      MEDS:   Current Outpatient Medications on File Prior to Visit  Medication Sig Dispense Refill  . ibuprofen (ADVIL) 600 MG tablet Take 1 tablet (600 mg total) by mouth every 6 (six) hours as needed for moderate pain or cramping. (Patient not taking: No sig reported) 30 tablet 0  . Prenatal Vit-Fe Fumarate-FA (PRENATAL VITAMIN PO) Take by mouth. (Patient not taking: No sig reported)     No current facility-administered medications on file prior to visit.    ALLERGIES:  Penicillins  Family History  Problem Relation Age of Onset  . Heart attack Father   . Stroke Father   . Clotting disorder Father   . Hypertension Mother     SH:  Married, non smoker  Review of Systems  All other systems reviewed and are negative.   PHYSICAL EXAMINATION:    BP 114/69   Pulse 75   Resp 16   Ht 5\' 5"  (1.651 m)   Wt 140 lb (63.5 kg)   Breastfeeding No   BMI 23.30 kg/m     Physical Exam Constitutional:      Appearance: Normal appearance.  Neurological:     General: No focal deficit present.     Mental Status: She is alert.  Psychiatric:        Mood and Affect: Mood normal.     Assessment/Plan: 1. Encounter for IUD insertion - pt decided not to proceed today  2. Advised about oral contraception - rx for Alesse to pharmacy for 90 day supply/4RF.   - return for AEX after 12/20222 - risks reviewed as documented above

## 2021-01-17 NOTE — Patient Instructions (Signed)
Hormonal Contraception Information Hormonal contraception is a type of birth control that uses hormones to prevent pregnancy. It usually involves a combination of the hormones estrogen and progesterone, or only the hormone progesterone. Hormonal contraception works in these ways:  It thickens the mucus in the cervix, which is the lowest part of the uterus. Thicker mucus makes it harder for sperm to enter the uterus.  It changes the lining of the uterus. This makes it harder for an egg to attach or implant.  It may stop the ovaries from releasing eggs (ovulation). Some women who take hormonal contraceptives that contain only progesterone may continue to ovulate. Hormonal contraception cannot prevent STIs (sexually transmitted infections). Pregnancy may still occur. Types of hormonal contraception Estrogen and progesterone contraceptives Contraceptives that use a combination of estrogen and progesterone are available in these forms:  Pill. Pills come in different combinations of hormones. Pills must be taken at the same time each day. They can affect your period. You can get your period monthly, once every 3 months, or not at all.  Patch. The patch is applied to the buttocks, abdomen, upper outer arm, or back. It is kept in place for 3 weeks. It is removed for the last or fourth week of the cycle.  Vaginal ring. The ring is placed in the vagina and left there for 3 weeks. It is then removed for the last or fourth week of the cycle.   Progesterone-only contraceptives Contraceptives that use only progesterone are available in these forms:  Pill. Pills should be taken at the same time everyday. This is very important to decrease the chance of pregnancy. Pills containing progestin-only are usually taken every day of the cycle. Other types of pills may have a placebo tablet for the last 4 days of every cycle.  Intrauterine device (IUD). This device is inserted through the vagina and cervix into the  uterus. It is removed or replaced every 3 to 5 years, depending on the type. It can be removed sooner.  Implant. Plastic rods are placed under the skin of the upper arm. They are removed or replaced every 3 years. They can be removed sooner.  Shot (injection). The injection is given once every 12 or 13 weeks (about 3 months).   Risks associated with hormonal contraception Estrogen and progesterone contraceptives can sometimes cause side effects, such as:  Nausea.  Headaches.  Breast tenderness.  Bleeding or spotting between menstrual cycles.  High blood pressure (rare).  Strokes, heart attacks, or blood clots (rare). Progesterone-only contraceptives also can have side effects, such as:  Nausea.  Headaches.  Breast tenderness.  Irregular menstrual bleeding.  High blood pressure (rare). Talk to your health care provider about what side effects may mean for you. Questions to ask:  What type of hormonal contraception is right for me?  How long should I plan to use hormonal contraception?  What are the side effects of the hormonal contraception method I choose?  How can I prevent STIs while using hormonal contraception? Where to find more information Ask your health care provider for more information and resources about hormonal contraception. You can also go to:  U.S. Department of Health and [employer], Office on Women's Health: [place].tn Summary  Estrogen and progesterone are hormones used in many forms of birth control.  Hormonal contraception cannot prevent STIs (sexually transmitted infections).  Talk to your health care provider about what side effects may mean for you.  Ask your health care provider for more information and  resources about hormonal contraception. This information is not intended to replace advice given to you by your health care provider. Make sure you discuss any questions you have with your health care provider. Document  Revised: 05/08/2020 Document Reviewed: 05/08/2020 Elsevier Patient Education  2021 [employer].

## 2021-01-18 MED ORDER — LEVONORGESTREL-ETHINYL ESTRAD 0.1-20 MG-MCG PO TABS
1.0000 | ORAL_TABLET | Freq: Every day | ORAL | 3 refills | Status: DC
Start: 2021-01-18 — End: 2022-01-27

## 2021-03-06 IMAGING — US US MFM OB DETAIL+14 WK
1 series · 13 of 28 positions shown · non-contrast
Comparison: none

[Series 1: us mfm ob detail+14 wk · 161 acquisitions, 13 frames shown]
[im 6/161]
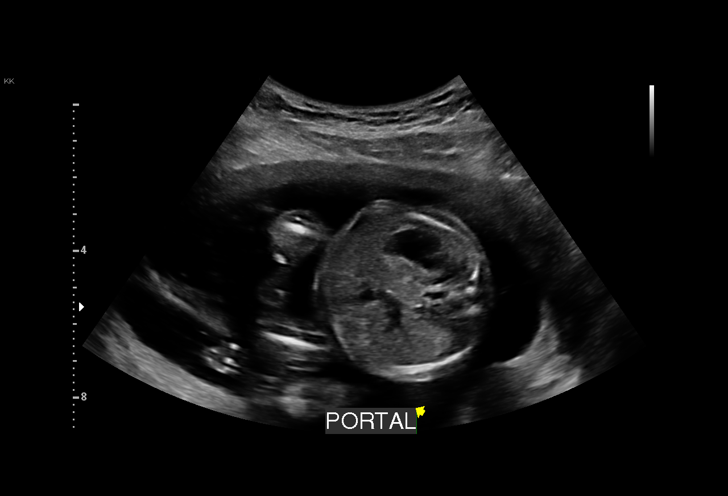
[im 18/161]
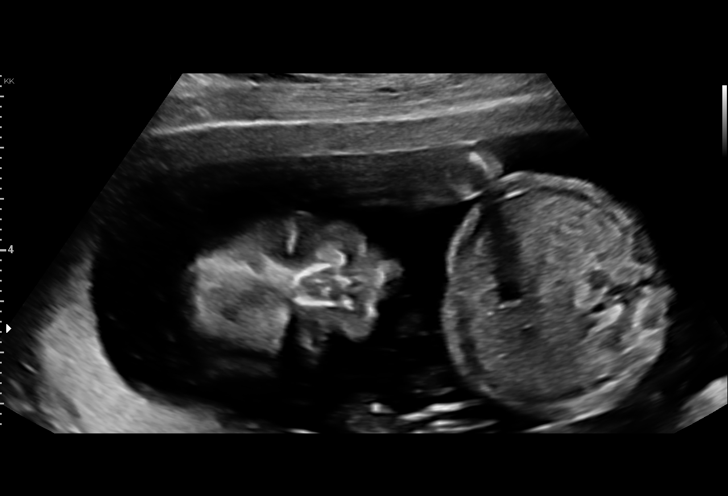
[im 30/161]
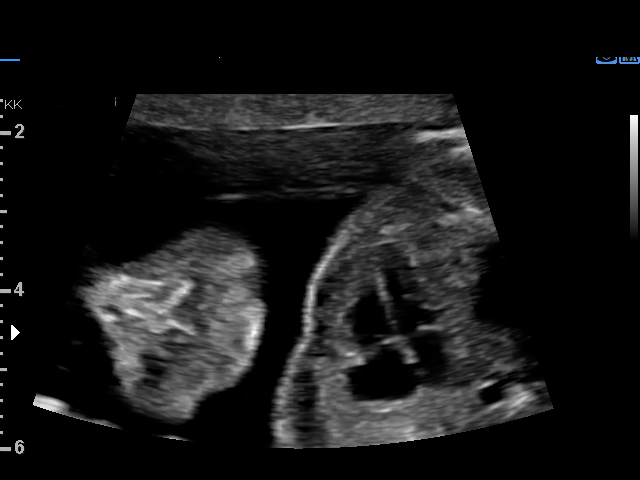
[im 42/161]
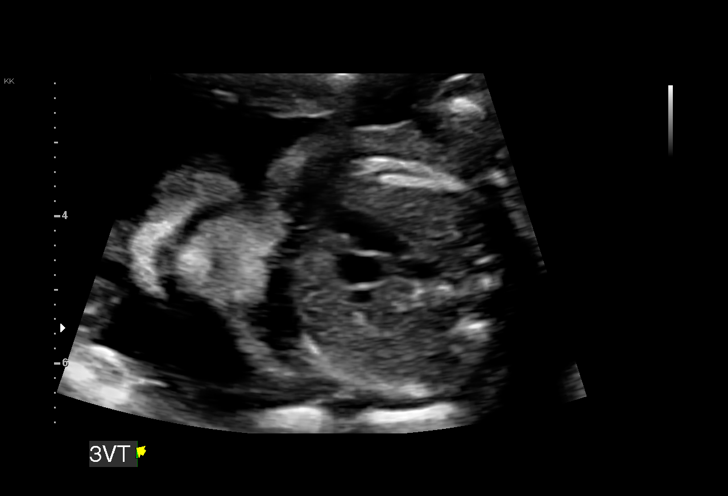
[im 54/161]
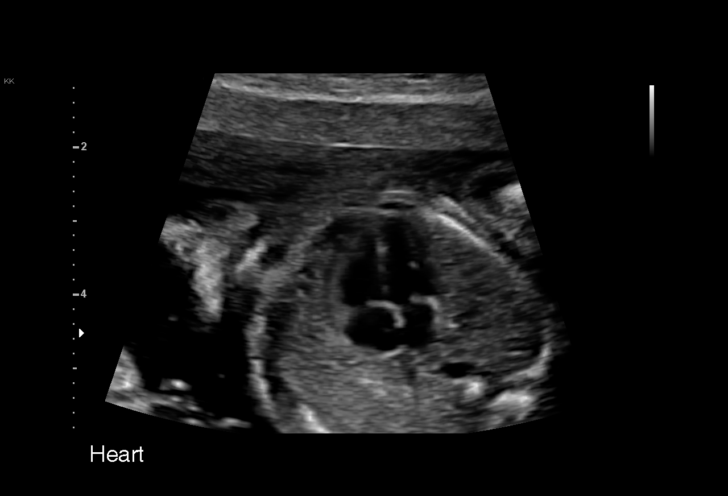
[im 66/161]
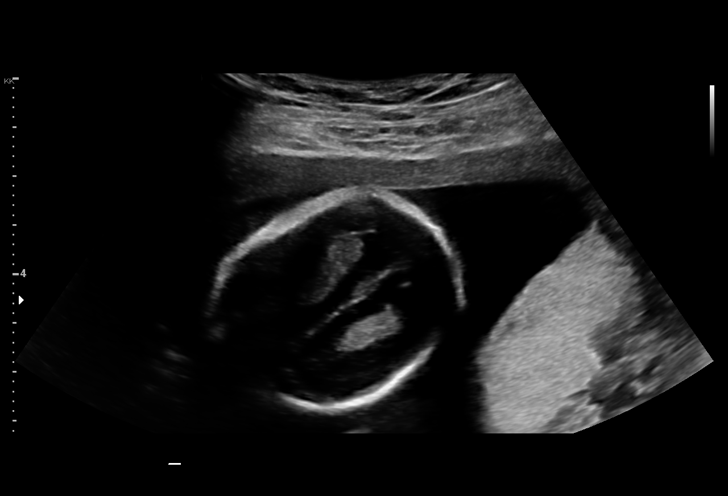
[im 83/161]
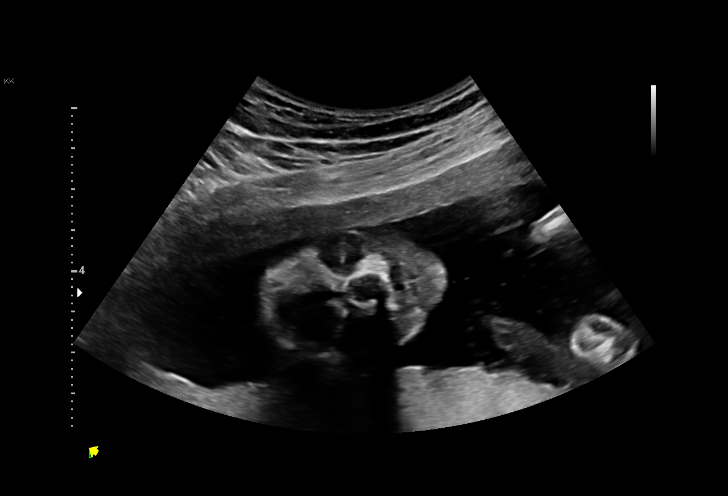
[im 95/161]
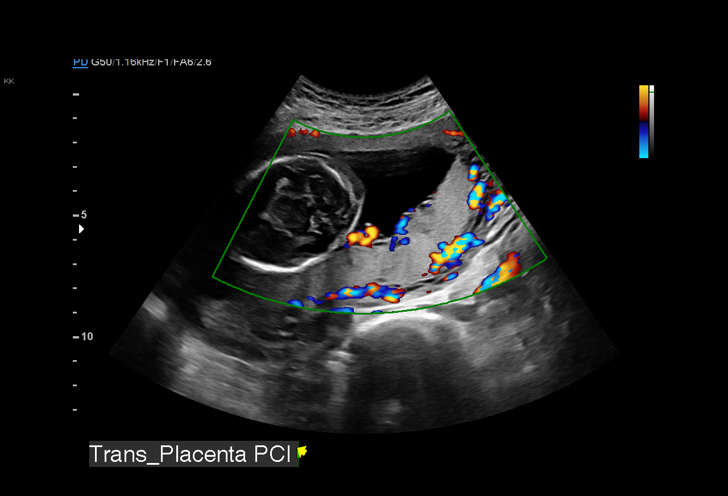
[im 107/161]
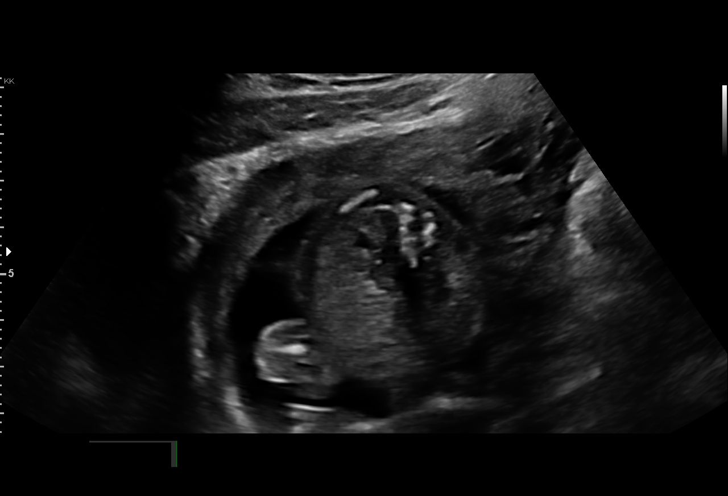
[im 119/161]
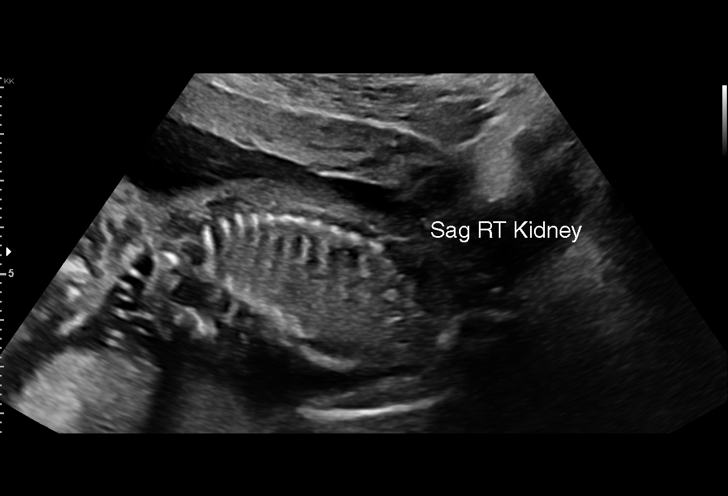
[im 131/161]
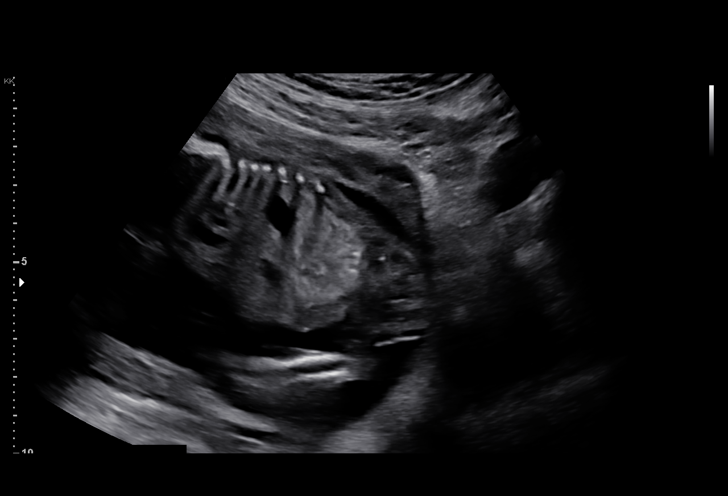
[im 143/161]
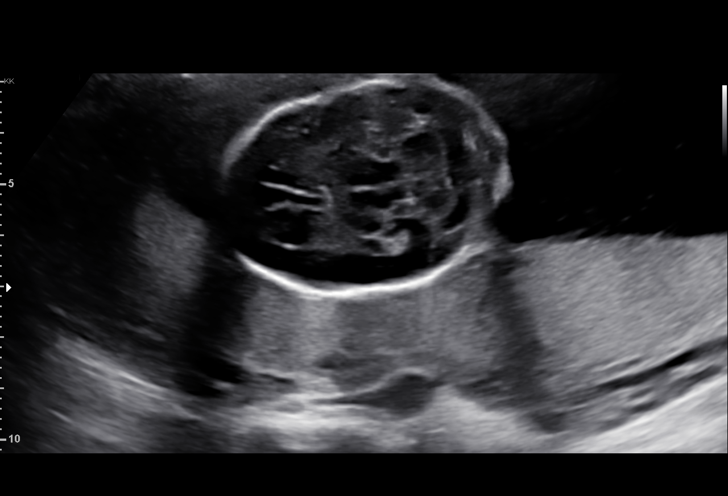
[im 155/161]
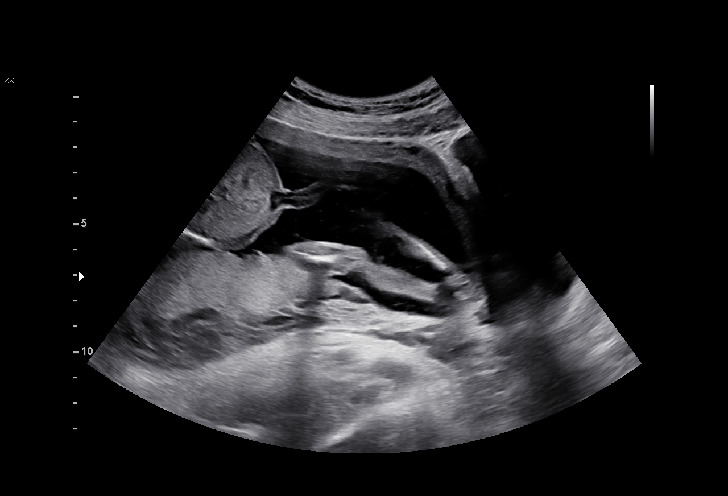

[13 of 28 positions shown; findings below may reference images not displayed]

ARTIAGA NP

Indications

 Echogenic intracardiac focus of the heart
 (EIF)
 Encounter for antenatal screening for
 malformations
 Previous cervical surgery (LEEP)
 19 weeks gestation of pregnancy
Fetal Evaluation

 Num Of Fetuses:         1
 Fetal Heart Rate(bpm):  155
 Cardiac Activity:       Observed
 Presentation:           Breech
 Placenta:               Posterior
 P. Cord Insertion:      Visualized

 Amniotic Fluid
 AFI FV:      Within normal limits

                             Largest Pocket(cm)
                             4
Biometry

 BPD:      41.2  mm     G. Age:  18w 3d         18  %    CI:        69.69   %    70 - 86
                                                         FL/HC:      17.5   %    16.1 -
 HC:      157.5  mm     G. Age:  18w 4d         15  %    HC/AC:      1.00        1.09 -
 AC:      156.9  mm     G. Age:  20w 6d         89  %    FL/BPD:     67.0   %
 FL:       27.6  mm     G. Age:  18w 3d         16  %    FL/AC:      17.6   %    20 - 24
 HUM:      27.6  mm     G. Age:  18w 6d         39  %
 Est. FW:     301  gm    0 lb 11 oz      64  %
OB History

 Gravidity:    1
Gestational Age

 LMP:           18w 4d        Date:  11/27/19                 EDD:   09/02/20
 U/S Today:     19w 1d                                        EDD:   08/29/20
 Best:          19w 2d     Det. By:  Early Ultrasound         EDD:   08/28/20
                                     (02/16/20)
Anatomy

 Cranium:               Appears normal         Aortic Arch:            Appears normal
 Cavum:                 Appears normal         Ductal Arch:            Appears normal
 Ventricles:            Appears normal         Diaphragm:              Appears normal
 Choroid Plexus:        Appears normal         Stomach:                Appears normal, left
                                                                       sided
 Cerebellum:            Appears normal         Abdomen:                Appears normal
 Posterior Fossa:       Appears normal         Abdominal Wall:         Appears nml (cord
                                                                       insert, abd wall)
 Nuchal Fold:           Appears normal         Cord Vessels:           Appears normal (3
                                                                       vessel cord)
 Face:                  Appears normal         Kidneys:                Appear normal
                        (orbits and profile)
 Lips:                  Appears normal         Bladder:                Appears normal
 Thoracic:              Appears normal         Spine:                  Appears normal
 Heart:                 Appears normal; EIF    Upper Extremities:      Appears normal
 RVOT:                  Appears normal         Lower Extremities:      Appears normal
 LVOT:                  Appears normal

 Other:  Fetus appears to be female. Hands, feet, Heels, 5th digit, and Open
         hands visualized.
Cervix Uterus Adnexa

 Cervix
 Length:           3.46  cm.
 Normal appearance by transabdominal scan.
Comments

 This patient was seen for a detailed fetal anatomy scan.
 She denies any significant past medical history and denies
 any problems in her current pregnancy.
 The patient has declined all screening tests for fetal
 aneuploidy in her current pregnancy.
 She was informed that the fetal growth and amniotic fluid
 level were appropriate for her gestational age.
 On today's exam, an intracardiac echogenic focus was noted
 in the left ventricle of the fetal heart.  The small association
 between an echogenic focus and Down syndrome was
 discussed. Due to the echogenic focus noted today, the
 patient was offered and declined an amniocentesis today for
 definitive diagnosis of fetal aneuploidy.  She she was also
 offered and declined a cell free DNA test to screen for fetal
 aneuploidy today.
 The patient was informed that anomalies may be missed due
 to technical limitations. If the fetus is in a suboptimal position
 or maternal habitus is increased, visualization of the fetus in
 the maternal uterus may be impaired.
 Follow up as indicated.

## 2021-08-04 ENCOUNTER — Telehealth: Payer: Self-pay | Admitting: *Deleted

## 2021-08-04 NOTE — Telephone Encounter (Signed)
Left patient a message to call and schedule annual that is due after 08/25/2021.

## 2021-10-06 ENCOUNTER — Other Ambulatory Visit: Payer: Self-pay | Admitting: Obstetrics & Gynecology

## 2022-01-01 ENCOUNTER — Other Ambulatory Visit: Payer: Self-pay | Admitting: Obstetrics & Gynecology

## 2022-01-22 ENCOUNTER — Ambulatory Visit: Payer: Medicaid Other | Admitting: Obstetrics and Gynecology

## 2022-01-26 ENCOUNTER — Other Ambulatory Visit: Payer: Self-pay | Admitting: Obstetrics & Gynecology

## 2022-01-27 ENCOUNTER — Other Ambulatory Visit: Payer: Self-pay

## 2022-01-27 MED ORDER — LEVONORGESTREL-ETHINYL ESTRAD 0.1-20 MG-MCG PO TABS: 1.0000 | ORAL_TABLET | Freq: Every day | ORAL | 0 refills | Status: DC

## 2022-01-27 NOTE — Progress Notes (Signed)
One refill of OCP sent to pt's pharmacy until pt has annual appt ?

## 2022-01-29 ENCOUNTER — Telehealth: Payer: Self-pay

## 2022-01-29 MED ORDER — LEVONORGESTREL-ETHINYL ESTRAD 0.1-20 MG-MCG PO TABS
1.0000 | ORAL_TABLET | Freq: Every day | ORAL | 0 refills | Status: DC
Start: 2022-01-29 — End: 2022-02-13

## 2022-01-29 NOTE — Telephone Encounter (Signed)
Pharmacy states they did not receive OCP Rx. Rx resent.

## 2022-02-05 ENCOUNTER — Ambulatory Visit: Payer: Medicaid Other | Admitting: Obstetrics and Gynecology

## 2022-02-13 ENCOUNTER — Encounter: Payer: Self-pay | Admitting: Obstetrics and Gynecology

## 2022-02-13 ENCOUNTER — Other Ambulatory Visit (HOSPITAL_COMMUNITY)
Admission: RE | Admit: 2022-02-13 | Discharge: 2022-02-13 | Disposition: A | Discharge status: Home / Self Care | Payer: Medicaid Other | Source: Ambulatory Visit | Attending: Obstetrics and Gynecology | Admitting: Obstetrics and Gynecology

## 2022-02-13 ENCOUNTER — Ambulatory Visit: Payer: Medicaid Other | Admitting: Obstetrics and Gynecology

## 2022-02-13 VITALS — BP 113/73 | HR 81 | Resp 16 | Ht 65.0 in | Wt 140.0 lb

## 2022-02-13 DIAGNOSIS — Z01419 Encounter for gynecological examination (general) (routine) without abnormal findings: Secondary | ICD-10-CM

## 2022-02-13 MED ORDER — LEVONORGESTREL-ETHINYL ESTRAD 0.1-20 MG-MCG PO TABS: 1.0000 | ORAL_TABLET | Freq: Every day | ORAL | 11 refills | Status: DC

## 2022-02-13 NOTE — Progress Notes (Signed)
GYNECOLOGY ANNUAL PREVENTATIVE CARE ENCOUNTER NOTE  History:     Ariel Stephens is a 31 y.o. G52P1001 female here for a routine annual gynecologic exam.  Current complaints: None.   Denies abnormal vaginal bleeding, discharge, pelvic pain, problems with intercourse or other gynecologic concerns.    Gynecologic History Patient's last menstrual period was 02/03/2022. Contraception: OCP (estrogen/progesterone) Last Pap: 2019. Result was normal with negative HPV Last Mammogram: NA.  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 Was focal on endocervical margin. Three subsequent pap smears were normal.   CIN III (cervical intraepithelial neoplasia III) 04/23/2011    Past Surgical History:  Procedure Laterality Date   LEEP     WISDOM TOOTH EXTRACTION      Current Outpatient Medications on File Prior to Visit  Medication Sig Dispense Refill   levonorgestrel-ethinyl estradiol (ALESSE) 0.1-20 MG-MCG tablet Take 1 tablet by mouth daily. 31 tablet 0   No current facility-administered medications on file prior to visit.    Allergies  Allergen Reactions   Penicillins Rash    Social History:  reports that she quit smoking about 12 years ago. Her smoking use included cigarettes. She has never used smokeless tobacco. She reports that she does not drink alcohol and does not use drugs.  Family History  Problem Relation Age of Onset   Heart attack Father    Stroke Father    Clotting disorder Father    Hypertension Mother     The following portions of the patient's history were reviewed and updated  as appropriate: allergies, current medications, past family history, past medical history, past social history, past surgical history and problem list.  Review of Systems Pertinent items noted in HPI and remainder of comprehensive ROS otherwise negative.  Physical Exam:  BP 113/73   Pulse 81   Resp 16   Ht 5\' 5"  (1.651 m)   Wt 140 lb (63.5 kg)   LMP 02/03/2022   Breastfeeding No   BMI 23.30 kg/m  CONSTITUTIONAL: Well-developed, well-nourished female in no acute distress.  HENT:  Normocephalic, atraumatic, External right and left ear normal.  EYES: Conjunctivae and EOM are normal. Pupils are equal, round, and reactive to light. No scleral icterus.  NECK: Normal range of motion, supple, no masses.  Normal thyroid.  SKIN: Skin is warm and dry. No rash noted. Not diaphoretic. No erythema. No pallor. MUSCULOSKELETAL: Normal range of motion. No tenderness.  No cyanosis, clubbing, or edema. NEUROLOGIC: Alert and oriented to person, place, and time. Normal reflexes, muscle tone coordination.  PSYCHIATRIC: Normal mood and affect. Normal behavior. Normal judgment and thought content. CARDIOVASCULAR: Normal heart rate noted, regular rhythm RESPIRATORY: Clear to auscultation bilaterally. Effort and breath sounds normal, no problems with respiration noted. BREASTS: Symmetric in size. No masses, tenderness, skin changes, nipple drainage, or lymphadenopathy bilaterally. Performed in the presence of a chaperone. ABDOMEN: Soft, no distention noted.  No tenderness, rebound or guarding.  PELVIC: Normal appearing external genitalia and urethral meatus; normal  appearing vaginal mucosa and cervix.  No abnormal vaginal discharge noted.  Pap smear obtained.  Normal uterine size, no other palpable masses, no uterine or adnexal tenderness.  Performed in the presence of a chaperone.   Assessment and Plan:   1. Well woman exam  - Cytology - PAP( [place])  - Refill on BC pills - Return in 1 year.   Will  follow up results of pap smear and manage accordingly. Routine preventative health maintenance measures emphasized. Please refer to After Visit Summary for other counseling recommendations.    Allisen Pidgeon, Artist Pais, [place] for [employer], [place]

## 2022-02-16 LAB — CYTOLOGY - PAP
Adequacy: ABSENT
Comment: NEGATIVE
Diagnosis: NEGATIVE
High risk HPV: NEGATIVE

## 2022-03-23 ENCOUNTER — Other Ambulatory Visit: Payer: Self-pay | Admitting: *Deleted

## 2022-03-23 MED ORDER — LEVONORGESTREL-ETHINYL ESTRAD 0.1-20 MG-MCG PO TABS
1.0000 | ORAL_TABLET | Freq: Every day | ORAL | 11 refills | Status: DC
Start: 2022-03-23 — End: 2022-04-02

## 2022-04-02 ENCOUNTER — Other Ambulatory Visit: Payer: Self-pay

## 2022-04-02 DIAGNOSIS — Z789 Other specified health status: Secondary | ICD-10-CM

## 2022-04-02 MED ORDER — LEVONORGESTREL-ETHINYL ESTRAD 0.1-20 MG-MCG PO TABS: 1.0000 | ORAL_TABLET | Freq: Every day | ORAL | 11 refills | Status: DC

## 2022-04-02 NOTE — Progress Notes (Signed)
OCP Rx sent to different pharmacy per pt request

## 2022-04-03 ENCOUNTER — Other Ambulatory Visit: Payer: Self-pay

## 2022-04-03 DIAGNOSIS — Z789 Other specified health status: Secondary | ICD-10-CM

## 2022-04-03 MED ORDER — LEVONORGESTREL-ETHINYL ESTRAD 0.1-20 MG-MCG PO TABS: 1.0000 | ORAL_TABLET | Freq: Every day | ORAL | 11 refills | Status: DC

## 2022-04-03 NOTE — Progress Notes (Signed)
Rx for OCP didn't go through electronically. Rx printed and faxed to pharmacy

## 2023-05-10 ENCOUNTER — Encounter: Payer: Self-pay | Admitting: Obstetrics and Gynecology

## 2023-05-10 ENCOUNTER — Ambulatory Visit (INDEPENDENT_AMBULATORY_CARE_PROVIDER_SITE_OTHER): Payer: Self-pay | Admitting: Obstetrics and Gynecology

## 2023-05-10 DIAGNOSIS — Z01419 Encounter for gynecological examination (general) (routine) without abnormal findings: Secondary | ICD-10-CM

## 2023-05-10 DIAGNOSIS — Z3041 Encounter for surveillance of contraceptive pills: Secondary | ICD-10-CM

## 2023-05-10 DIAGNOSIS — Z789 Other specified health status: Secondary | ICD-10-CM

## 2023-05-10 MED ORDER — LEVONORGESTREL-ETHINYL ESTRAD 0.1-20 MG-MCG PO TABS: 1.0000 | ORAL_TABLET | Freq: Every day | ORAL | 3 refills | Status: DC

## 2023-05-10 NOTE — Progress Notes (Signed)
   ANNUAL EXAM Patient name: Ariel Stephens MRN [id number]  Date of birth: [birth year] Chief Complaint:   Annual Exam  History of Present Illness:   Ariel Stephens is a 32 y.o. G1P1001 with Patient's last menstrual period was 04/29/2023. being seen today for a routine annual exam.  Current complaints: Episode of breast pain a few weeks ago.   Upstream - 05/10/23 0404       Contraception Wrap Up   End Method Oral Contraceptive    How was the end contraceptive method provided? Prescription            The pregnancy intention screening data noted above was reviewed. Potential methods of contraception were discussed. The patient elected to proceed with Oral Contraceptive.   No contraindications to estrogen.   Last pap 02/13/22. Results were: NILM w/ HRHPV negative. H/O abnormal pap: yes hx CIN3 & AIS s/p LEEP 2013 . All subsequent paps have been NILM/HPV neg. Will need q49yr paps x 25 years or until hysterectomy Last mammogram: n/a. Results were: N/A. Family h/o breast cancer: no Last colonoscopy: n/a. Results were: N/A. Family h/o colorectal cancer: no      No data to display               No data to display           Review of Systems:   Pertinent items are noted in HPI Denies any headaches, blurred vision, fatigue, shortness of breath, chest pain, abdominal pain, abnormal vaginal discharge/itching/odor/irritation, problems with periods, bowel movements, urination, or intercourse unless otherwise stated above. Pertinent History Reviewed:  Reviewed past medical,surgical, social and family history.  Reviewed problem list, medications and allergies. Physical Assessment:   Vitals:   05/10/23 1522  BP: 125/79  Pulse: 76  Weight: 152 lb (68.9 kg)  Height: 5\' 5"  (1.651 m)  Body mass index is 25.29 kg/m.        Physical Examination:   General appearance - well appearing, and in no distress  Mental status - alert, oriented to person, place, and time  Chest -  respiratory effort normal  Heart - normal peripheral perfusion  Breasts - breasts appear normal, no suspicious masses, no skin or nipple changes or axillary nodes  Abdomen - soft, nontender, nondistended, no masses or organomegaly  Pelvic - deferred after discussion of r/b  Chaperone present for exam  No results found for this or any previous visit (from the past 24 hour(s)).  Assessment & Plan:  1) Well-Woman Exam Mammogram: @ 33yo, or sooner if problems Colonoscopy: @ 32yo, or sooner if problems Pap: Due 2026 Refilled OCPs  2) Breast pain Normal exam, reassurance provided. Discussed breast self awareness.  Labs/procedures today:  No orders of the defined types were placed in this encounter.  Meds:  Meds ordered this encounter  Medications   DISCONTD: levonorgestrel-ethinyl estradiol (ALESSE) 0.1-20 MG-MCG tablet    Sig: Take 1 tablet by mouth daily.    Dispense:  90 tablet    Refill:  3   levonorgestrel-ethinyl estradiol (ALESSE) 0.1-20 MG-MCG tablet    Sig: Take 1 tablet by mouth daily.    Dispense:  90 tablet    Refill:  3   Follow-up: Return in about 1 year (around 05/09/2024) for annual exam.  Lennart Pall, MD 05/10/2023 3:57 PM

## 2023-05-10 NOTE — Progress Notes (Signed)
Last pap- 02/13/22- negative

## 2024-04-12 ENCOUNTER — Encounter: Payer: Self-pay | Admitting: Obstetrics and Gynecology

## 2024-04-12 ENCOUNTER — Other Ambulatory Visit: Payer: Self-pay

## 2024-04-12 DIAGNOSIS — Z789 Other specified health status: Secondary | ICD-10-CM

## 2024-04-12 MED ORDER — LEVONORGESTREL-ETHINYL ESTRAD 0.1-20 MG-MCG PO TABS: 1.0000 | ORAL_TABLET | Freq: Every day | ORAL | 0 refills | Status: DC

## 2024-05-10 ENCOUNTER — Ambulatory Visit: Payer: Self-pay | Admitting: Obstetrics and Gynecology

## 2024-07-03 ENCOUNTER — Telehealth: Payer: Self-pay | Admitting: *Deleted

## 2024-07-03 NOTE — Telephone Encounter (Signed)
 Returned call from 9:51 AM. Left patient a message to call and reschedule missed appointment.

## 2024-08-24 ENCOUNTER — Ambulatory Visit (INDEPENDENT_AMBULATORY_CARE_PROVIDER_SITE_OTHER): Payer: Self-pay | Admitting: Obstetrics and Gynecology

## 2024-08-24 ENCOUNTER — Encounter: Payer: Self-pay | Admitting: Obstetrics and Gynecology

## 2024-08-24 DIAGNOSIS — Z789 Other specified health status: Secondary | ICD-10-CM

## 2024-08-24 MED ORDER — LEVONORGESTREL-ETHINYL ESTRAD 0.1-20 MG-MCG PO TABS: 1.0000 | ORAL_TABLET | Freq: Every day | ORAL | 3 refills | Status: AC

## 2024-08-24 NOTE — Progress Notes (Signed)
 ANNUAL EXAM Patient name: Ariel Stephens MRN [id number]  Date of birth: [birth year] Chief Complaint:   Gynecologic Exam (Pt reports no issues and needs her birth control refill , last pap was June 2023)  History of Present Illness:   Ariel Stephens is a 33 y.o. G1P1001 with Patient's last menstrual period was 08/08/2024 (exact date). being seen today for a routine annual exam.  Current complaints:  Episode of left breast pain that she suspects was muscle related and has resolved spontaneously. She has not palpated any masses/lumps   Upstream - 08/24/24 0859       Pregnancy Intention Screening   Does the patient want to become pregnant in the next year? No    Would the patient like to discuss contraceptive options today? No      Contraception Wrap Up   Current Method Oral Contraceptive    End Method Oral Contraceptive    Contraception Counseling Provided No    How was the end contraceptive method provided? Prescription         The pregnancy intention screening data noted above was reviewed. Potential methods of contraception were discussed. The patient elected to proceed with Oral Contraceptive.   Last pap 02/13/22. Results were: NILM w/ HRHPV negative. H/O abnormal pap: yes hx CIN3 & AIS s/p LEEP 2013. All subsequent paps have been NILM/HPV neg. Will need q36yr paps x 25 years or until hysterectomy Last mammogram: n/a. Results were: N/A. Family h/o breast cancer: no Last colonoscopy: n/a. Results were: N/A. Family h/o colorectal cancer: no HPV vaccine: unsure      No data to display               No data to display           Review of Systems:   Pertinent items are noted in HPI Denies any headaches, blurred vision, fatigue, shortness of breath, chest pain, abdominal pain, abnormal vaginal discharge/itching/odor/irritation, problems with periods, bowel movements, urination, or intercourse unless otherwise stated above. Pertinent History Reviewed:  Reviewed  past medical,surgical, social and family history.  Reviewed problem list, medications and allergies. Physical Assessment:   Vitals:   08/24/24 0825  BP: 102/69  Pulse: 76  Weight: 143 lb (64.9 kg)  Height: 5' 5 (1.651 m)  Body mass index is 23.8 kg/m.        Physical Examination:   General appearance - well appearing, and in no distress  Mental status - alert, oriented to person, place, and time  Chest - respiratory effort normal  Heart - normal peripheral perfusion  Breasts - breasts appear normal, no suspicious masses, no skin or nipple changes or axillary nodes  Pelvic - deferred  Chaperone present for exam  No results found for this or any previous visit (from the past 24 hours).  Assessment & Plan:  1) Well-Woman Exam Mammogram: @ 34yo, or sooner if problems Colonoscopy: @ 34yo, or sooner if problems Pap: Due 2026. Discussed option for hysterectomy after completing childbearing to further reduce risk of AIS/adenocarinoma of cervix/uterus. Gardasil: Discussed, considering OCP refill sent  Labs/procedures today:   No orders of the defined types were placed in this encounter.  Meds:  Meds ordered this encounter  Medications   levonorgestrel -ethinyl estradiol (ALESSE) 0.1-20 MG-MCG tablet    Sig: Take 1 tablet by mouth daily.    Dispense:  90 tablet    Refill:  3    Follow-up: Return in about 6 months (around 02/22/2025) for annual exam with  pap.  Kieth JAYSON Carolin, MD 08/24/2024 8:59 AM
